# Patient Record
Sex: Female | Born: 1970 | Race: White | Hispanic: No | Marital: Married | Smoking: Never smoker
Health system: Southern US, Community
[De-identification: ages and names within clinical notes are randomized; demographics above are authoritative.]

## PROBLEM LIST (undated history)

## (undated) DIAGNOSIS — J45990 Exercise induced bronchospasm: Secondary | ICD-10-CM

## (undated) DIAGNOSIS — G4730 Sleep apnea, unspecified: Secondary | ICD-10-CM

## (undated) DIAGNOSIS — F32A Depression, unspecified: Secondary | ICD-10-CM

## (undated) DIAGNOSIS — G43909 Migraine, unspecified, not intractable, without status migrainosus: Secondary | ICD-10-CM

## (undated) DIAGNOSIS — K219 Gastro-esophageal reflux disease without esophagitis: Secondary | ICD-10-CM

## (undated) DIAGNOSIS — G4700 Insomnia, unspecified: Secondary | ICD-10-CM

## (undated) DIAGNOSIS — J302 Other seasonal allergic rhinitis: Secondary | ICD-10-CM

## (undated) DIAGNOSIS — D649 Anemia, unspecified: Secondary | ICD-10-CM

## (undated) DIAGNOSIS — E782 Mixed hyperlipidemia: Secondary | ICD-10-CM

## (undated) DIAGNOSIS — F329 Major depressive disorder, single episode, unspecified: Secondary | ICD-10-CM

## (undated) DIAGNOSIS — R030 Elevated blood-pressure reading, without diagnosis of hypertension: Secondary | ICD-10-CM

## (undated) DIAGNOSIS — N979 Female infertility, unspecified: Secondary | ICD-10-CM

## (undated) DIAGNOSIS — N393 Stress incontinence (female) (male): Secondary | ICD-10-CM

## (undated) DIAGNOSIS — E282 Polycystic ovarian syndrome: Secondary | ICD-10-CM

## (undated) DIAGNOSIS — G43009 Migraine without aura, not intractable, without status migrainosus: Secondary | ICD-10-CM

## (undated) DIAGNOSIS — D250 Submucous leiomyoma of uterus: Secondary | ICD-10-CM

## (undated) DIAGNOSIS — N920 Excessive and frequent menstruation with regular cycle: Secondary | ICD-10-CM

## (undated) HISTORY — PX: BLADDER REPAIR: SHX76

## (undated) HISTORY — PX: HYSTERECTOMY: SHX81

## (undated) HISTORY — PX: BLADDER SUSPENSION: SHX72

## (undated) HISTORY — DX: Depression, unspecified: F32.A

## (undated) HISTORY — DX: Stress incontinence (female) (male): N39.3

## (undated) HISTORY — DX: Female infertility, unspecified: N97.9

## (undated) HISTORY — DX: Excessive and frequent menstruation with regular cycle: N92.0

## (undated) HISTORY — DX: Migraine without aura, not intractable, without status migrainosus: G43.009

## (undated) HISTORY — DX: Mixed hyperlipidemia: E78.2

## (undated) HISTORY — DX: Polycystic ovarian syndrome: E28.2

## (undated) HISTORY — DX: Major depressive disorder, single episode, unspecified: F32.9

## (undated) HISTORY — PX: WISDOM TOOTH EXTRACTION: SHX21

## (undated) HISTORY — DX: Migraine, unspecified, not intractable, without status migrainosus: G43.909

## (undated) HISTORY — DX: Elevated blood-pressure reading, without diagnosis of hypertension: R03.0

---

## 2012-09-14 HISTORY — PX: URETHRAL SLING: SHX2621

## 2016-07-01 DIAGNOSIS — R110 Nausea: Secondary | ICD-10-CM | POA: Diagnosis not present

## 2016-07-01 DIAGNOSIS — R5383 Other fatigue: Secondary | ICD-10-CM | POA: Diagnosis not present

## 2016-07-01 DIAGNOSIS — R51 Headache: Secondary | ICD-10-CM | POA: Diagnosis not present

## 2016-07-01 DIAGNOSIS — R42 Dizziness and giddiness: Secondary | ICD-10-CM | POA: Diagnosis not present

## 2016-07-08 DIAGNOSIS — F4323 Adjustment disorder with mixed anxiety and depressed mood: Secondary | ICD-10-CM | POA: Diagnosis not present

## 2016-07-20 DIAGNOSIS — F4323 Adjustment disorder with mixed anxiety and depressed mood: Secondary | ICD-10-CM | POA: Diagnosis not present

## 2016-08-14 DIAGNOSIS — R300 Dysuria: Secondary | ICD-10-CM | POA: Diagnosis not present

## 2016-08-14 DIAGNOSIS — Z6829 Body mass index (BMI) 29.0-29.9, adult: Secondary | ICD-10-CM | POA: Diagnosis not present

## 2016-08-14 DIAGNOSIS — R319 Hematuria, unspecified: Secondary | ICD-10-CM | POA: Diagnosis not present

## 2016-08-31 DIAGNOSIS — Z0000 Encounter for general adult medical examination without abnormal findings: Secondary | ICD-10-CM | POA: Diagnosis not present

## 2016-09-08 DIAGNOSIS — E663 Overweight: Secondary | ICD-10-CM | POA: Diagnosis not present

## 2016-09-08 DIAGNOSIS — G43119 Migraine with aura, intractable, without status migrainosus: Secondary | ICD-10-CM | POA: Diagnosis not present

## 2016-09-08 DIAGNOSIS — J3089 Other allergic rhinitis: Secondary | ICD-10-CM | POA: Diagnosis not present

## 2016-09-08 DIAGNOSIS — Z0000 Encounter for general adult medical examination without abnormal findings: Secondary | ICD-10-CM | POA: Diagnosis not present

## 2016-09-08 DIAGNOSIS — F419 Anxiety disorder, unspecified: Secondary | ICD-10-CM | POA: Diagnosis not present

## 2016-10-06 DIAGNOSIS — Z1231 Encounter for screening mammogram for malignant neoplasm of breast: Secondary | ICD-10-CM | POA: Diagnosis not present

## 2016-10-08 DIAGNOSIS — Z01419 Encounter for gynecological examination (general) (routine) without abnormal findings: Secondary | ICD-10-CM | POA: Diagnosis not present

## 2016-10-08 DIAGNOSIS — Z6829 Body mass index (BMI) 29.0-29.9, adult: Secondary | ICD-10-CM | POA: Diagnosis not present

## 2016-10-13 DIAGNOSIS — R1904 Left lower quadrant abdominal swelling, mass and lump: Secondary | ICD-10-CM | POA: Diagnosis not present

## 2017-02-24 DIAGNOSIS — J018 Other acute sinusitis: Secondary | ICD-10-CM | POA: Diagnosis not present

## 2017-02-24 DIAGNOSIS — H10023 Other mucopurulent conjunctivitis, bilateral: Secondary | ICD-10-CM | POA: Diagnosis not present

## 2017-03-13 DIAGNOSIS — E282 Polycystic ovarian syndrome: Secondary | ICD-10-CM | POA: Diagnosis not present

## 2017-03-13 DIAGNOSIS — R030 Elevated blood-pressure reading, without diagnosis of hypertension: Secondary | ICD-10-CM

## 2017-03-13 DIAGNOSIS — Z888 Allergy status to other drugs, medicaments and biological substances status: Secondary | ICD-10-CM | POA: Diagnosis not present

## 2017-03-13 DIAGNOSIS — I63512 Cerebral infarction due to unspecified occlusion or stenosis of left middle cerebral artery: Secondary | ICD-10-CM | POA: Diagnosis not present

## 2017-03-13 DIAGNOSIS — G43109 Migraine with aura, not intractable, without status migrainosus: Secondary | ICD-10-CM | POA: Diagnosis not present

## 2017-03-13 DIAGNOSIS — E559 Vitamin D deficiency, unspecified: Secondary | ICD-10-CM | POA: Diagnosis not present

## 2017-03-13 DIAGNOSIS — Z7951 Long term (current) use of inhaled steroids: Secondary | ICD-10-CM | POA: Diagnosis not present

## 2017-03-13 DIAGNOSIS — Z79899 Other long term (current) drug therapy: Secondary | ICD-10-CM | POA: Diagnosis not present

## 2017-03-13 DIAGNOSIS — Z823 Family history of stroke: Secondary | ICD-10-CM | POA: Diagnosis not present

## 2017-03-13 DIAGNOSIS — I639 Cerebral infarction, unspecified: Secondary | ICD-10-CM | POA: Diagnosis not present

## 2017-03-13 DIAGNOSIS — R51 Headache: Secondary | ICD-10-CM | POA: Diagnosis not present

## 2017-03-13 DIAGNOSIS — Z793 Long term (current) use of hormonal contraceptives: Secondary | ICD-10-CM | POA: Diagnosis not present

## 2017-03-13 DIAGNOSIS — Z9889 Other specified postprocedural states: Secondary | ICD-10-CM | POA: Diagnosis not present

## 2017-03-13 DIAGNOSIS — E782 Mixed hyperlipidemia: Secondary | ICD-10-CM | POA: Insufficient documentation

## 2017-03-13 DIAGNOSIS — Z820 Family history of epilepsy and other diseases of the nervous system: Secondary | ICD-10-CM | POA: Diagnosis not present

## 2017-03-13 DIAGNOSIS — G43009 Migraine without aura, not intractable, without status migrainosus: Secondary | ICD-10-CM | POA: Insufficient documentation

## 2017-03-13 DIAGNOSIS — Z8249 Family history of ischemic heart disease and other diseases of the circulatory system: Secondary | ICD-10-CM | POA: Diagnosis not present

## 2017-03-13 DIAGNOSIS — R29810 Facial weakness: Secondary | ICD-10-CM | POA: Diagnosis not present

## 2017-03-13 DIAGNOSIS — R202 Paresthesia of skin: Secondary | ICD-10-CM | POA: Diagnosis not present

## 2017-03-13 DIAGNOSIS — Z87828 Personal history of other (healed) physical injury and trauma: Secondary | ICD-10-CM | POA: Diagnosis not present

## 2017-03-13 DIAGNOSIS — R200 Anesthesia of skin: Secondary | ICD-10-CM | POA: Diagnosis not present

## 2017-03-13 HISTORY — DX: Migraine without aura, not intractable, without status migrainosus: G43.009

## 2017-03-13 HISTORY — DX: Mixed hyperlipidemia: E78.2

## 2017-03-13 HISTORY — DX: Elevated blood-pressure reading, without diagnosis of hypertension: R03.0

## 2017-03-14 DIAGNOSIS — E282 Polycystic ovarian syndrome: Secondary | ICD-10-CM | POA: Diagnosis not present

## 2017-03-14 DIAGNOSIS — R030 Elevated blood-pressure reading, without diagnosis of hypertension: Secondary | ICD-10-CM | POA: Diagnosis not present

## 2017-03-14 DIAGNOSIS — G43109 Migraine with aura, not intractable, without status migrainosus: Secondary | ICD-10-CM | POA: Diagnosis not present

## 2017-03-14 DIAGNOSIS — E782 Mixed hyperlipidemia: Secondary | ICD-10-CM | POA: Diagnosis not present

## 2017-03-14 DIAGNOSIS — R29810 Facial weakness: Secondary | ICD-10-CM | POA: Diagnosis not present

## 2017-03-14 DIAGNOSIS — G43009 Migraine without aura, not intractable, without status migrainosus: Secondary | ICD-10-CM | POA: Diagnosis not present

## 2017-03-15 DIAGNOSIS — G43009 Migraine without aura, not intractable, without status migrainosus: Secondary | ICD-10-CM | POA: Diagnosis not present

## 2017-03-15 DIAGNOSIS — R030 Elevated blood-pressure reading, without diagnosis of hypertension: Secondary | ICD-10-CM | POA: Diagnosis not present

## 2017-03-15 DIAGNOSIS — E782 Mixed hyperlipidemia: Secondary | ICD-10-CM | POA: Diagnosis not present

## 2017-03-16 DIAGNOSIS — E782 Mixed hyperlipidemia: Secondary | ICD-10-CM | POA: Diagnosis not present

## 2017-03-16 DIAGNOSIS — G43009 Migraine without aura, not intractable, without status migrainosus: Secondary | ICD-10-CM | POA: Diagnosis not present

## 2017-03-16 DIAGNOSIS — R030 Elevated blood-pressure reading, without diagnosis of hypertension: Secondary | ICD-10-CM | POA: Diagnosis not present

## 2017-03-23 DIAGNOSIS — E282 Polycystic ovarian syndrome: Secondary | ICD-10-CM | POA: Diagnosis not present

## 2017-04-13 DIAGNOSIS — F4323 Adjustment disorder with mixed anxiety and depressed mood: Secondary | ICD-10-CM | POA: Diagnosis not present

## 2017-04-20 DIAGNOSIS — F4323 Adjustment disorder with mixed anxiety and depressed mood: Secondary | ICD-10-CM | POA: Diagnosis not present

## 2017-04-21 DIAGNOSIS — F4323 Adjustment disorder with mixed anxiety and depressed mood: Secondary | ICD-10-CM | POA: Diagnosis not present

## 2017-04-28 DIAGNOSIS — F4323 Adjustment disorder with mixed anxiety and depressed mood: Secondary | ICD-10-CM | POA: Diagnosis not present

## 2017-05-05 DIAGNOSIS — F4323 Adjustment disorder with mixed anxiety and depressed mood: Secondary | ICD-10-CM | POA: Diagnosis not present

## 2017-05-12 DIAGNOSIS — F4323 Adjustment disorder with mixed anxiety and depressed mood: Secondary | ICD-10-CM | POA: Diagnosis not present

## 2017-05-19 DIAGNOSIS — F4323 Adjustment disorder with mixed anxiety and depressed mood: Secondary | ICD-10-CM | POA: Diagnosis not present

## 2017-05-24 DIAGNOSIS — M461 Sacroiliitis, not elsewhere classified: Secondary | ICD-10-CM | POA: Diagnosis not present

## 2017-05-24 DIAGNOSIS — E6609 Other obesity due to excess calories: Secondary | ICD-10-CM | POA: Diagnosis not present

## 2017-05-24 DIAGNOSIS — G43119 Migraine with aura, intractable, without status migrainosus: Secondary | ICD-10-CM | POA: Diagnosis not present

## 2017-05-24 DIAGNOSIS — F419 Anxiety disorder, unspecified: Secondary | ICD-10-CM | POA: Diagnosis not present

## 2017-05-26 DIAGNOSIS — F4323 Adjustment disorder with mixed anxiety and depressed mood: Secondary | ICD-10-CM | POA: Diagnosis not present

## 2017-06-22 DIAGNOSIS — F4323 Adjustment disorder with mixed anxiety and depressed mood: Secondary | ICD-10-CM | POA: Diagnosis not present

## 2017-06-30 DIAGNOSIS — F4323 Adjustment disorder with mixed anxiety and depressed mood: Secondary | ICD-10-CM | POA: Diagnosis not present

## 2017-07-07 DIAGNOSIS — F4323 Adjustment disorder with mixed anxiety and depressed mood: Secondary | ICD-10-CM | POA: Diagnosis not present

## 2017-07-14 DIAGNOSIS — F4323 Adjustment disorder with mixed anxiety and depressed mood: Secondary | ICD-10-CM | POA: Diagnosis not present

## 2017-08-04 DIAGNOSIS — F4323 Adjustment disorder with mixed anxiety and depressed mood: Secondary | ICD-10-CM | POA: Diagnosis not present

## 2017-08-10 DIAGNOSIS — F4323 Adjustment disorder with mixed anxiety and depressed mood: Secondary | ICD-10-CM | POA: Diagnosis not present

## 2017-08-17 DIAGNOSIS — F4323 Adjustment disorder with mixed anxiety and depressed mood: Secondary | ICD-10-CM | POA: Diagnosis not present

## 2017-08-24 DIAGNOSIS — F4323 Adjustment disorder with mixed anxiety and depressed mood: Secondary | ICD-10-CM | POA: Diagnosis not present

## 2017-12-03 DIAGNOSIS — B373 Candidiasis of vulva and vagina: Secondary | ICD-10-CM | POA: Diagnosis not present

## 2017-12-03 DIAGNOSIS — J0140 Acute pansinusitis, unspecified: Secondary | ICD-10-CM | POA: Diagnosis not present

## 2017-12-21 HISTORY — PX: FOOT MASS EXCISION: SHX1663

## 2018-05-02 ENCOUNTER — Ambulatory Visit (INDEPENDENT_AMBULATORY_CARE_PROVIDER_SITE_OTHER): Payer: BLUE CROSS/BLUE SHIELD | Admitting: Obstetrics & Gynecology

## 2018-05-02 ENCOUNTER — Other Ambulatory Visit: Payer: Self-pay

## 2018-05-02 ENCOUNTER — Encounter: Payer: Self-pay | Admitting: Obstetrics & Gynecology

## 2018-05-02 ENCOUNTER — Encounter

## 2018-05-02 ENCOUNTER — Other Ambulatory Visit (HOSPITAL_COMMUNITY)
Admission: RE | Admit: 2018-05-02 | Discharge: 2018-05-02 | Disposition: A | Discharge status: Home / Self Care | Payer: BLUE CROSS/BLUE SHIELD | Source: Ambulatory Visit | Attending: Obstetrics & Gynecology | Admitting: Obstetrics & Gynecology

## 2018-05-02 VITALS — BP 124/78 | HR 96 | Resp 16 | Ht 63.25 in | Wt 190.0 lb

## 2018-05-02 DIAGNOSIS — Z124 Encounter for screening for malignant neoplasm of cervix: Secondary | ICD-10-CM

## 2018-05-02 DIAGNOSIS — N920 Excessive and frequent menstruation with regular cycle: Secondary | ICD-10-CM | POA: Insufficient documentation

## 2018-05-02 DIAGNOSIS — Z1211 Encounter for screening for malignant neoplasm of colon: Secondary | ICD-10-CM | POA: Diagnosis not present

## 2018-05-02 DIAGNOSIS — N393 Stress incontinence (female) (male): Secondary | ICD-10-CM

## 2018-05-02 DIAGNOSIS — N926 Irregular menstruation, unspecified: Secondary | ICD-10-CM

## 2018-05-02 DIAGNOSIS — G43909 Migraine, unspecified, not intractable, without status migrainosus: Secondary | ICD-10-CM | POA: Insufficient documentation

## 2018-05-02 DIAGNOSIS — Z8249 Family history of ischemic heart disease and other diseases of the circulatory system: Secondary | ICD-10-CM

## 2018-05-02 DIAGNOSIS — Z01419 Encounter for gynecological examination (general) (routine) without abnormal findings: Secondary | ICD-10-CM | POA: Diagnosis not present

## 2018-05-02 HISTORY — DX: Excessive and frequent menstruation with regular cycle: N92.0

## 2018-05-02 HISTORY — DX: Stress incontinence (female) (male): N39.3
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
## 2018-05-03 LAB — CYTOLOGY - PAP
Diagnosis: NEGATIVE
HPV: NOT DETECTED

## 2018-05-06 LAB — ANTI MULLERIAN HORMONE: ANTI-MULLERIAN HORMONE (AMH): 0.241 ng/mL

## 2018-05-09 DIAGNOSIS — Z1231 Encounter for screening mammogram for malignant neoplasm of breast: Secondary | ICD-10-CM | POA: Diagnosis not present

## 2018-05-10 ENCOUNTER — Telehealth: Payer: Self-pay | Admitting: Obstetrics & Gynecology

## 2018-05-10 NOTE — Telephone Encounter (Signed)
Routing to [place] for review of [place] results from 5/13.

## 2018-05-10 NOTE — Telephone Encounter (Signed)
Patient called to see if her results from her last visit on 05/02/18 are ready yet.

## 2018-05-10 NOTE — Telephone Encounter (Signed)
Her AMH is 0.2 showing diminshed ovarian reserve.  My guess is that she will menopausal in the next couple of years but this may be longer than she wants to wait.  Is considering hysterectomy.

## 2018-05-11 NOTE — Telephone Encounter (Signed)
Spoke with patient. Results given. Patient verbalizes understanding. Patient would like to proceed with PUS and then further discuss hysterectomy with Dr.Miller. PUS scheduled for 06/02/2018 at 12:30 pm with 1 pm consult with Dr.Miller. Patient is agreeable to date and time.   Routing to provider for final review. Patient agreeable to disposition. Will close encounter.

## 2018-05-17 ENCOUNTER — Ambulatory Visit (INDEPENDENT_AMBULATORY_CARE_PROVIDER_SITE_OTHER): Payer: BLUE CROSS/BLUE SHIELD | Admitting: Family Medicine

## 2018-05-17 ENCOUNTER — Encounter: Payer: Self-pay | Admitting: Family Medicine

## 2018-05-17 VITALS — BP 122/84 | HR 92 | Temp 98.7°F | Ht 63.6 in | Wt 195.1 lb

## 2018-05-17 DIAGNOSIS — Z8659 Personal history of other mental and behavioral disorders: Secondary | ICD-10-CM | POA: Diagnosis not present

## 2018-05-17 DIAGNOSIS — Z0000 Encounter for general adult medical examination without abnormal findings: Secondary | ICD-10-CM | POA: Diagnosis not present

## 2018-05-17 DIAGNOSIS — G43009 Migraine without aura, not intractable, without status migrainosus: Secondary | ICD-10-CM | POA: Diagnosis not present

## 2018-05-17 DIAGNOSIS — J302 Other seasonal allergic rhinitis: Secondary | ICD-10-CM

## 2018-05-17 DIAGNOSIS — Z131 Encounter for screening for diabetes mellitus: Secondary | ICD-10-CM

## 2018-05-17 DIAGNOSIS — E782 Mixed hyperlipidemia: Secondary | ICD-10-CM | POA: Diagnosis not present

## 2018-05-17 DIAGNOSIS — E282 Polycystic ovarian syndrome: Secondary | ICD-10-CM | POA: Diagnosis not present

## 2018-05-17 DIAGNOSIS — Z8249 Family history of ischemic heart disease and other diseases of the circulatory system: Secondary | ICD-10-CM | POA: Diagnosis not present

## 2018-05-17 DIAGNOSIS — Z1211 Encounter for screening for malignant neoplasm of colon: Secondary | ICD-10-CM | POA: Diagnosis not present
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
## 2018-05-17 NOTE — Patient Instructions (Addendum)
Remember the lab in the clinic is open each week day from 7:30 AM to 5:30 PM. Diet for Polycystic Ovarian Syndrome Polycystic ovary syndrome (PCOS) is a disorder of the chemical messengers (hormones) that regulate menstruation. The condition causes important hormones to be out of balance. PCOS can:  Make your periods irregular or stop.  Cause cysts to develop on the ovaries.  Make it difficult to get pregnant.  Stop your body from responding to the effects of insulin (insulin resistance), which can lead to obesity and diabetes.  Changing what you eat can help manage PCOS and improve your health. It can help you lose weight and improve the way your body uses insulin. What is my plan?  Eat breakfast, lunch, and dinner plus two snacks every day.  Include protein in each meal and snack.  Choose whole grains instead of products made with refined flour.  Eat a variety of foods.  Exercise regularly as told by your health care provider. What do I need to know about this eating plan? If you are overweight or obese, pay attention to how many calories you eat. Cutting down on calories can help you lose weight. Work with your health care provider or dietitian to figure out how many calories you need each day. What foods can I eat? Grains Whole grains, such as whole wheat. Whole-grain breads, crackers, cereals, and pasta. Unsweetened oatmeal, bulgur, barley, quinoa, or brown rice. Corn or whole-wheat flour tortillas. Vegetables  Lettuce. Spinach. Peas. Beets. Cauliflower. Cabbage. Broccoli. Carrots. Tomatoes. Squash. Eggplant. Herbs. Peppers. Onions. Cucumbers. Brussels sprouts. Fruits Berries. Bananas. Apples. Oranges. Grapes. Papaya. Mango. Pomegranate. Kiwi. Grapefruit. Cherries. Meats and Other Protein Sources Lean proteins, such as fish, chicken, beans, eggs, and tofu. Dairy Low-fat dairy products, such as skim milk, cheese sticks, and yogurt. Beverages Low-fat or fat-free drinks, such  as water, low-fat milk, sugar-free drinks, and 100% fruit juice. Condiments Ketchup. Mustard. Barbecue sauce. Relish. Low-fat or fat-free mayonnaise. Fats and Oils Olive oil or canola oil. Walnuts and almonds. The items listed above may not be a complete list of recommended foods or beverages. Contact your dietitian for more options. What foods are not recommended? Foods high in calories or fat. Fried foods. Sweets. Products made from refined white flour, including white bread, pastries, white rice, and pasta. The items listed above may not be a complete list of foods and beverages to avoid. Contact your dietitian for more information. This information is not intended to replace advice given to you by your health care provider. Make sure you discuss any questions you have with your health care provider. Document Released: 03/30/2016 Document Revised: 05/14/2016 Document Reviewed: 12/19/2014 Elsevier Interactive Patient Education  2018 [place].  Polycystic Ovarian Syndrome Polycystic ovarian syndrome (PCOS) is a common hormonal disorder among women of reproductive age. In most women with PCOS, many small fluid-filled sacs (cysts) grow on the ovaries, and the cysts are not part of a normal menstrual cycle. PCOS can cause problems with your menstrual periods and make it difficult to get pregnant. It can also cause an increased risk of miscarriage with pregnancy. If it is not treated, PCOS can lead to serious health problems, such as diabetes and heart disease. What are the causes? The cause of PCOS is not known, but it may be the result of a combination of certain factors, such as:  Irregular menstrual cycle.  High levels of certain hormones (androgens).  Problems with the hormone that helps to control blood sugar (insulin resistance).  Certain genes.  What increases the risk? This condition is more likely to develop in women who have a family history of PCOS. What are the signs or  symptoms? Symptoms of PCOS may include:  Multiple ovarian cysts.  Infrequent periods or no periods.  Periods that are too frequent or too heavy.  Unpredictable periods.  Inability to get pregnant (infertility) because of not ovulating.  Increased growth of hair on the face, chest, stomach, back, thumbs, thighs, or toes.  Acne or oily skin. Acne may develop during adulthood, and it may not respond to treatment.  Pelvic pain.  Weight gain or obesity.  Patches of thickened and dark brown or black skin on the neck, arms, breasts, or thighs (acanthosis nigricans).  Excess hair growth on the face, chest, abdomen, or upper thighs (hirsutism).  How is this diagnosed? This condition is diagnosed based on:  Your medical history.  A physical exam, including a pelvic exam. Your health care provider may look for areas of increased hair growth on your skin.  Tests, such as: ? Ultrasound. This may be used to examine the ovaries and the lining of the uterus (endometrium) for cysts. ? Blood tests. These may be used to check levels of sugar (glucose), female hormone (testosterone), and female hormones (estrogen and progesterone) in your blood.  How is this treated? There is no cure for PCOS, but treatment can help to manage symptoms and prevent more health problems from developing. Treatment varies depending on:  Your symptoms.  Whether you want to have a baby or whether you need birth control (contraception).  Treatment may include nutrition and lifestyle changes along with:  Progesterone hormone to start a menstrual period.  Birth control pills to help you have regular menstrual periods.  Medicines to make you ovulate, if you want to get pregnant.  Medicine to reduce excessive hair growth.  Surgery, in severe cases. This may involve making small holes in one or both of your ovaries. This decreases the amount of testosterone that your body produces.  Follow these instructions at  home:  Take over-the-counter and prescription medicines only as told by your health care provider.  Follow a healthy meal plan. This can help you reduce the effects of PCOS. ? Eat a healthy diet that includes lean proteins, complex carbohydrates, fresh fruits and vegetables, low-fat dairy products, and healthy fats. Make sure to eat enough fiber.  If you are overweight, lose weight as told by your health care provider. ? Losing 10% of your body weight may improve symptoms. ? Your health care provider can determine how much weight loss is best for you and can help you lose weight safely.  Keep all follow-up visits as told by your health care provider. This is important. Contact a health care provider if:  Your symptoms do not get better with medicine.  You develop new symptoms. This information is not intended to replace advice given to you by your health care provider. Make sure you discuss any questions you have with your health care provider. Document Released: 04/02/2005 Document Revised: 08/04/2016 Document Reviewed: 05/24/2016 Elsevier Interactive Patient Education  2018 [place] Years, Female Preventive care refers to lifestyle choices and visits with your health care provider that can promote health and wellness. What does preventive care include?  A yearly physical exam. This is also called an annual well check.  Dental exams once or twice a year.  Routine eye exams. Ask your health care provider how often  you should have your eyes checked.  Personal lifestyle choices, including: ? Daily care of your teeth and gums. ? Regular physical activity. ? Eating a healthy diet. ? Avoiding tobacco and drug use. ? Limiting alcohol use. ? Practicing safe sex. ? Taking low-dose aspirin daily starting at age 75. ? Taking vitamin and mineral supplements as recommended by your health care provider. What happens during an annual well check? The services  and screenings done by your health care provider during your annual well check will depend on your age, overall health, lifestyle risk factors, and family history of disease. Counseling Your health care provider may ask you questions about your:  Alcohol use.  Tobacco use.  Drug use.  Emotional well-being.  Home and relationship well-being.  Sexual activity.  Eating habits.  Work and work Statistician.  Method of birth control.  Menstrual cycle.  Pregnancy history.  Screening You may have the following tests or measurements:  Height, weight, and BMI.  Blood pressure.  Lipid and cholesterol levels. These may be checked every 5 years, or more frequently if you are over 67 years old.  Skin check.  Lung cancer screening. You may have this screening every year starting at age 46 if you have a 30-pack-year history of smoking and currently smoke or have quit within the past 15 years.  Fecal occult blood test (FOBT) of the stool. You may have this test every year starting at age 27.  Flexible sigmoidoscopy or colonoscopy. You may have a sigmoidoscopy every 5 years or a colonoscopy every 10 years starting at age 79.  Hepatitis C blood test.  Hepatitis B blood test.  Sexually transmitted disease (STD) testing.  Diabetes screening. This is done by checking your blood sugar (glucose) after you have not eaten for a while (fasting). You may have this done every 1-3 years.  Mammogram. This may be done every 1-2 years. Talk to your health care provider about when you should start having regular mammograms. This may depend on whether you have a family history of breast cancer.  BRCA-related cancer screening. This may be done if you have a family history of breast, ovarian, tubal, or peritoneal cancers.  Pelvic exam and Pap test. This may be done every 3 years starting at age 72. Starting at age 91, this may be done every 5 years if you have a Pap test in combination with an HPV  test.  Bone density scan. This is done to screen for osteoporosis. You may have this scan if you are at high risk for osteoporosis.  Discuss your test results, treatment options, and if necessary, the need for more tests with your health care provider. Vaccines Your health care provider may recommend certain vaccines, such as:  Influenza vaccine. This is recommended every year.  Tetanus, diphtheria, and acellular pertussis (Tdap, Td) vaccine. You may need a Td booster every 10 years.  Varicella vaccine. You may need this if you have not been vaccinated.  Zoster vaccine. You may need this after age 35.  Measles, mumps, and rubella (MMR) vaccine. You may need at least one dose of MMR if you were born in 1957 or later. You may also need a second dose.  Pneumococcal 13-valent conjugate (PCV13) vaccine. You may need this if you have certain conditions and were not previously vaccinated.  Pneumococcal polysaccharide (PPSV23) vaccine. You may need one or two doses if you smoke cigarettes or if you have certain conditions.  Meningococcal vaccine. You may need this if  you have certain conditions.  Hepatitis A vaccine. You may need this if you have certain conditions or if you travel or work in places where you may be exposed to hepatitis A.  Hepatitis B vaccine. You may need this if you have certain conditions or if you travel or work in places where you may be exposed to hepatitis B.  Haemophilus influenzae type b (Hib) vaccine. You may need this if you have certain conditions.  Talk to your health care provider about which screenings and vaccines you need and how often you need them. This information is not intended to replace advice given to you by your health care provider. Make sure you discuss any questions you have with your health care provider. Document Released: 01/03/2016 Document Revised: 08/26/2016 Document Reviewed: 10/08/2015 Elsevier Interactive Patient Education  [employer].

## 2018-05-20 ENCOUNTER — Other Ambulatory Visit (INDEPENDENT_AMBULATORY_CARE_PROVIDER_SITE_OTHER): Payer: BLUE CROSS/BLUE SHIELD

## 2018-05-20 DIAGNOSIS — Z131 Encounter for screening for diabetes mellitus: Secondary | ICD-10-CM | POA: Diagnosis not present

## 2018-05-20 DIAGNOSIS — E782 Mixed hyperlipidemia: Secondary | ICD-10-CM

## 2018-05-20 DIAGNOSIS — Z0000 Encounter for general adult medical examination without abnormal findings: Secondary | ICD-10-CM

## 2018-05-20 LAB — CBC WITH DIFFERENTIAL/PLATELET
BASOS ABS: 0.1 10*3/uL (ref 0.0–0.1)
Basophils Relative: 1 % (ref 0.0–3.0)
EOS ABS: 0.1 10*3/uL (ref 0.0–0.7)
Eosinophils Relative: 1.1 % (ref 0.0–5.0)
HEMATOCRIT: 39 % (ref 36.0–46.0)
Hemoglobin: 13.1 g/dL (ref 12.0–15.0)
LYMPHS PCT: 21.4 % (ref 12.0–46.0)
Lymphs Abs: 1.8 10*3/uL (ref 0.7–4.0)
MCHC: 33.5 g/dL (ref 30.0–36.0)
MCV: 96.4 fl (ref 78.0–100.0)
Monocytes Absolute: 0.5 10*3/uL (ref 0.1–1.0)
Monocytes Relative: 5.3 % (ref 3.0–12.0)
NEUTROS PCT: 71.2 % (ref 43.0–77.0)
Neutro Abs: 6.1 10*3/uL (ref 1.4–7.7)
PLATELETS: 465 10*3/uL — AB (ref 150.0–400.0)
RBC: 4.05 Mil/uL (ref 3.87–5.11)
RDW: 12.4 % (ref 11.5–15.5)
WBC: 8.6 10*3/uL (ref 4.0–10.5)

## 2018-05-20 LAB — BASIC METABOLIC PANEL
BUN: 17 mg/dL (ref 6–23)
CALCIUM: 9.2 mg/dL (ref 8.4–10.5)
CO2: 26 meq/L (ref 19–32)
CREATININE: 0.75 mg/dL (ref 0.40–1.20)
Chloride: 104 mEq/L (ref 96–112)
GFR: 87.86 mL/min (ref >60.00)
Glucose, Bld: 90 mg/dL (ref 70–99)
Potassium: 4.1 mEq/L (ref 3.5–5.1)
Sodium: 138 mEq/L (ref 135–145)

## 2018-05-20 LAB — LIPID PANEL
CHOLESTEROL: 200 mg/dL (ref 0–200)
HDL: 69.5 mg/dL (ref >39.00)
LDL CALC: 98 mg/dL (ref 0–99)
NONHDL: 130.83
Total CHOL/HDL Ratio: 3
Triglycerides: 164 mg/dL — ABNORMAL HIGH (ref 0.0–149.0)
VLDL: 32.8 mg/dL (ref 0.0–40.0)

## 2018-05-20 LAB — HEMOGLOBIN A1C: HEMOGLOBIN A1C: 5.5 % (ref 4.6–6.5)

## 2018-05-24 ENCOUNTER — Encounter: Payer: Self-pay | Admitting: Obstetrics & Gynecology

## 2018-05-25 ENCOUNTER — Telehealth: Payer: Self-pay | Admitting: Family Medicine

## 2018-05-25 LAB — FECAL OCCULT BLOOD, IMMUNOCHEMICAL: Fecal Occult Bld: NEGATIVE

## 2018-05-25 NOTE — Telephone Encounter (Signed)
Copied from [place] [id number]. Topic: Quick Communication - See Telephone Encounter >> May 25, 2018  4:25 PM Bea Graff, NT wrote: CRM for notification. See Telephone encounter for: 05/25/18. Pt calling back to get lab results.

## 2018-05-26 NOTE — Telephone Encounter (Signed)
Patient notified

## 2018-06-01 ENCOUNTER — Other Ambulatory Visit: Payer: Self-pay | Admitting: *Deleted

## 2018-06-01 DIAGNOSIS — N921 Excessive and frequent menstruation with irregular cycle: Secondary | ICD-10-CM

## 2018-06-01 DIAGNOSIS — N946 Dysmenorrhea, unspecified: Secondary | ICD-10-CM

## 2018-06-02 ENCOUNTER — Ambulatory Visit (INDEPENDENT_AMBULATORY_CARE_PROVIDER_SITE_OTHER): Payer: BLUE CROSS/BLUE SHIELD | Admitting: Obstetrics & Gynecology

## 2018-06-02 ENCOUNTER — Encounter: Payer: Self-pay | Admitting: Obstetrics & Gynecology

## 2018-06-02 ENCOUNTER — Ambulatory Visit (INDEPENDENT_AMBULATORY_CARE_PROVIDER_SITE_OTHER): Payer: BLUE CROSS/BLUE SHIELD

## 2018-06-02 VITALS — BP 122/70 | HR 80 | Resp 16 | Ht 63.25 in | Wt 190.0 lb

## 2018-06-02 DIAGNOSIS — N921 Excessive and frequent menstruation with irregular cycle: Secondary | ICD-10-CM | POA: Diagnosis not present

## 2018-06-02 DIAGNOSIS — N946 Dysmenorrhea, unspecified: Secondary | ICD-10-CM

## 2018-06-02 DIAGNOSIS — G43009 Migraine without aura, not intractable, without status migrainosus: Secondary | ICD-10-CM | POA: Diagnosis not present

## 2018-06-02 MED ORDER — NORGESTIM-ETH ESTRAD TRIPHASIC 0.18/0.215/0.25 MG-35 MCG PO TABS
1.0000 | ORAL_TABLET | Freq: Every day | ORAL | 2 refills | Status: DC
Start: 2018-06-02 — End: 2018-06-28

## 2018-06-02 NOTE — Progress Notes (Signed)
47 y.o. [id number] Married Caucasian female here for pelvic ultrasound due to menorrhagia and dysmenorrhea.  Recently stopped OCPs (after having a Botox injection that caused a facial nerve paralysis that resolved but was initially thought was a stroke).  She has undergone extensive conversation with her prior gynecologist, Dr. Margaretmary Dys, as well as her neurologist, Dr. Winfred Burn, in [place], regarding hysterectomy and ovary removal.  When she stopped the pills, her cycles were much worse and heavier.  In addition, her headaches have been worse worse.  They have always been cyclical in nature.  She did decide to restart the OCPs but really does not want to be back on these long-term.  Her neurologist thinks using OCPs are ok in the short term but would have her consider other options as well.  They do help significantly.  She is here for ultrasound as one has not been done recently and to discuss additional options, if appropriate.    Patient's last menstrual period was 05/03/2018.  Contraception: OCPs and condoms  Findings:  UTERUS: 7.7 x 4.6 x 4.1cm with two small fibroids that have submucosal components--1cm and 1.4cm EMS: 2.56mm ADNEXA: Left ovary: 2.3 x 1.5 x 1.2cm       Right ovary: 2.4 x 1.4 x 1.0cm CUL DE SAC: no free fluid  Discussion:  Findings reviewed with pt including location of fibroids and appearance of ovaries.  She continues to contemplate hysterectomy with ovary removal.  Procedure discussed with patient.  Hospital stay, recovery and pain management all discussed.  Risks discussed including but not limited to bleeding, 1% risk of receiving a  transfusion, infection, 3-4% risk of bowel/bladder/ureteral/vascular injury discussed as well as possible need for additional surgery if injury does occur discussed.  DVT/PE and rare risk of death discussed.  My actual complications with prior surgeries discussed.  Vaginal cuff dehiscence discussed.  Hernia formation discussed.  Positioning and  incision locations discussed.  Patient aware if pathology abnormal she may need additional treatment.    Also, ovary removal at this point would increase osteoporosis risk and cardiovascular risk.  I would recommend she start HRT after her surgery due to age, unless this causes increased headaches.    We also discussed additional options for treatment including Mirena IUD, other progesterone options including POPs and Depo Provera.  Pt understands these would also provide contraception so she would not need anything else for this concern.  She thinks she does not want to be on hormonal therapy for treatment but does want to discuss with her husband.  Information provided.  Questions answered.    Assessment:  Dysmenorrhea, menorrhagia, migraine headaches  Plan:  RF for Tirprevifem sent to pharmacy on file for pt Will have hysterectomy and IUD precerted for pt.    ~73 minutes spent with patient >50% of time was in face to face discussion of above.

## 2018-06-08 ENCOUNTER — Telehealth: Payer: Self-pay | Admitting: Obstetrics & Gynecology

## 2018-06-08 NOTE — Telephone Encounter (Signed)
Call placed to patient to review benefits for possible Mirena IUD insertion and possible hysterectomy. Left voicemail message requesting a return call

## 2018-06-09 NOTE — Telephone Encounter (Signed)
Patient returned call. Spoke with pt regarding benefits for recommended procedures. Patient understood and agreeable. Patient advises she would like to proceed with the surgical procedure. Patient has confirmed. Patient aware this is professional benefit only. Patient aware will be contacted by hospital for separate benefits. Forwarding to Conservation officer, historic buildings for scheduling  Routing to Lamont Snowball, RN

## 2018-06-09 NOTE — Telephone Encounter (Signed)
Call to patient. Surgery date options discussed. Patient requests to proceed with 07-25-18. Advised will schedule and call patient back with confirmation and complete surgery instructions and appointments.

## 2018-06-10 NOTE — Telephone Encounter (Signed)
Patient returning call to Sally. °

## 2018-06-13 NOTE — Telephone Encounter (Signed)
Call back to patient. Advised surgery is currently scheduled for 07-25-18 as previously discussed.  Checking with Dr Sabra Heck to be surge this is appropriate for 2nd case before proceeding further. Patient voiced understanding. Advised will call her back.

## 2018-06-20 NOTE — Telephone Encounter (Signed)
Patient is requesting to talk with Gay Filler about her upcoming surgery.

## 2018-06-20 NOTE — Telephone Encounter (Signed)
Return call to patient.  Advised have confirmed surgery date with Dr Sabra Heck.  Surgery instruction sheet reviewed and printed copy will be mailed as well as provided consult.   Routing to provider for final review. Will close encounter.

## 2018-06-21 ENCOUNTER — Ambulatory Visit: Payer: Self-pay | Admitting: Nurse Practitioner

## 2018-06-27 ENCOUNTER — Telehealth: Payer: Self-pay | Admitting: Obstetrics & Gynecology

## 2018-06-27 NOTE — Telephone Encounter (Signed)
Spoke with patient. In 3rd wk of 2nd pack of Tri-previfem, bleeding has not stopped. Describes flow as mild to moderate, changing pad 3 x/day on average. Reports increased menses cramps and back pain, craving red meat and fatigue. No missed or late pills. Scheduled for TLH on 07/25/18. Patient asking if OCP can be changed?   Advised will review with Dr. Sabra Heck and return call. Pharmacy on file verified.   Dr. Sabra Heck -please review and advise?

## 2018-06-27 NOTE — Telephone Encounter (Signed)
Patient called stating she is scheduled for surgery on 07/25/18. Patient states she has had continual bleeding for three weeks and wants to know if her birth control pills need to be changed.  Routing to Triage Nurse

## 2018-06-28 ENCOUNTER — Other Ambulatory Visit: Payer: Self-pay | Admitting: Obstetrics & Gynecology

## 2018-06-28 MED ORDER — NORETHINDRONE ACET-ETHINYL EST 1.5-30 MG-MCG PO TABS: 1.0000 | ORAL_TABLET | Freq: Every day | ORAL | 0 refills | Status: DC

## 2018-06-28 NOTE — Telephone Encounter (Signed)
Spoke with patient, advised as seen below per Dr. Sabra Heck. Rx to verified pharmacy. Patient verbalizes understanding and is agreeable. Encounter closed.

## 2018-06-28 NOTE — Telephone Encounter (Signed)
Yes.  Ok to change to Loestrin 1.5/30.  I would stop pills for five days and have whatever cycle occurs.  Then restart new pack.  Ok to send to pharmacy.

## 2018-06-28 NOTE — Telephone Encounter (Signed)
Patient calling regarding conversation with Sharee Pimple 06/27/18.

## 2018-06-28 NOTE — Telephone Encounter (Signed)
Routing to Dr. Miller

## 2018-07-05 ENCOUNTER — Other Ambulatory Visit: Payer: Self-pay

## 2018-07-05 ENCOUNTER — Ambulatory Visit (INDEPENDENT_AMBULATORY_CARE_PROVIDER_SITE_OTHER): Payer: BLUE CROSS/BLUE SHIELD | Admitting: Obstetrics & Gynecology

## 2018-07-05 ENCOUNTER — Encounter: Payer: Self-pay | Admitting: Obstetrics & Gynecology

## 2018-07-05 VITALS — BP 120/84 | HR 88 | Resp 16 | Ht 63.25 in | Wt 191.0 lb

## 2018-07-05 DIAGNOSIS — N921 Excessive and frequent menstruation with irregular cycle: Secondary | ICD-10-CM | POA: Diagnosis not present

## 2018-07-05 NOTE — Progress Notes (Signed)
47 y.o. [id number] MarriedCaucasian female here for discussion of upcoming procedure due to menorrhagia with irregular bleeding.  She stopped the triprevafem due to persistent bleeding for a month.  When she stopped the OCPs she had a very heavy cycle that lasted for a week.  She is now on Loestrin and she is only have a little spotting.  She moved from [place] where she and her previous gynecologist discussed treatment of bleeding and dysmenorrhea (as well as headaches).  She had decided to proceed with hysterectomy but then needed to move.  She has decided at this time to proceed with definitive treatment.  She has seriously contemplated ovary removal as well due to menstrual related headaches.  She has decided she does want to proceed with ovary removal as well.  She understands increased CVD risk as well as increased risks for osteopenia/osteoporosis.  She would like to start on HRT after surgery, if possible.  Discussed with patient risks and benefits including but not limited to risks of increased risks of heart disease, MI, stroke, DVT, and breast cancer.  She is ok with these risks.  Ultrasound performed 06/02/18 showed uterus measuring 7.7 x 4.6 x 4.1cm with two fibroids with submucosal components.  Endometrial biopsy recommended today as well.   Procedure discussed with patient.  Hospital stay, recovery and pain management all discussed.  Risks discussed including but not limited to bleeding, 1% risk of receiving a  transfusion, infection, 3-4% risk of bowel/bladder/ureteral/vascular injury discussed as well as possible need for additional surgery if injury does occur discussed.  DVT/PE and rare risk of death discussed.  My actual complications with prior surgeries discussed.  Vaginal cuff dehiscence discussed.  Hernia formation discussed.  Positioning and incision locations discussed.  Patient aware if pathology abnormal she may need additional treatment.  All questions answered.    Ob Hx:   No LMP  recorded.          Sexually active: Yes.   Birth control: oral contraceptives (estrogen/progesterone) Last pap: 05/02/18 Neg. HR HPV:neg  Last MMG: 05/09/18 [facility]. Normal  Tobacco: No  Past Surgical History:  Procedure Laterality Date  . URETHRAL SLING  09/14/2012   [place]    Past Medical History:  Diagnosis Date  . Depression   . Infertility, female   . Migraines   . PCOS (polycystic ovarian syndrome)   . Urinary incontinence     Allergies: Dexamethasone sodium phosphate and Pseudoephedrine hcl  Current Outpatient Medications  Medication Sig Dispense Refill  . cetirizine (ZYRTEC) 10 MG tablet Take by mouth daily as needed.    . cholecalciferol (VITAMIN D) 1000 units tablet Take by mouth daily.    . Cyanocobalamin (VITAMIN B 12 PO) Take by mouth daily.    . Diclofenac Potassium (ZIPSOR) 25 MG CAPS Take 1 capsule by mouth daily as needed.    . DULoxetine (CYMBALTA) 60 MG capsule Take 1 capsule by mouth daily.    Marland Kitchen eletriptan (RELPAX) 40 MG tablet Take 1 tablet by mouth 2 (two) times daily as needed.    Marland Kitchen Galcanezumab-gnlm (EMGALITY Plum) Inject into the skin every 30 (thirty) days.    . Magnesium 100 MG CAPS Take by mouth.    . Multiple Vitamins-Minerals (SUPER THERA VITE M) TABS Take by mouth daily.    . Norethindrone Acetate-Ethinyl Estradiol (LOESTRIN 1.5/30, 21,) 1.5-30 MG-MCG tablet Take 1 tablet by mouth daily. 3 Package 0  . OnabotulinumtoxinA (BOTOX IJ) every 3 (three) months.    . Probiotic Product (PROBIOTIC-10)  CAPS Take by mouth daily.    . SUMAtriptan Succinate (SUMAVEL DOSEPRO) 6 MG/0.5ML SOTJ as needed.    . traZODone (DESYREL) 50 MG tablet Take 1 tablet by mouth at bedtime.     No current facility-administered medications for this visit.     ROS: Pertinent items noted in HPI and remainder of comprehensive ROS otherwise negative.  Exam:    BP 120/84 (BP Location: Right Arm, Patient Position: Sitting, Cuff Size: Large)   Pulse 88   Resp 16   Ht 5' 3.25"  (1.607 m)   Wt 191 lb (86.6 kg)   BMI 33.57 kg/m   General appearance: alert and cooperative Head: Normocephalic, without obvious abnormality, atraumatic Neck: no adenopathy, supple, symmetrical, trachea midline and thyroid not enlarged, symmetric, no tenderness/mass/nodules Lungs: clear to auscultation bilaterally Heart: regular rate and rhythm, S1, S2 normal, no murmur, click, rub or gallop Abdomen: soft, non-tender; bowel sounds normal; no masses,  no organomegaly Extremities: extremities normal, atraumatic, no cyanosis or edema Skin: Skin color, texture, turgor normal. No rashes or lesions Lymph nodes: Cervical, supraclavicular, and axillary nodes normal. no inguinal nodes palpated Neurologic: Grossly normal  Pelvic: External genitalia:  no lesions              Urethra: normal appearing urethra with no masses, tenderness or lesions              Bartholins and Skenes: Bartholin's, Urethra, Skene's normal                 Vagina: normal appearing vagina with normal color and discharge, no lesions              Cervix: normal appearance              Pap taken: No.        Bimanual Exam:  Uterus:  uterus is normal size, shape, consistency and nontender                                      Adnexa:    normal adnexa in size, nontender and no masses                                      Anus:  normal sphincter tone, no lesions  Endometrial biopsy recommended.  Discussed with patient.  Verbal and written consent obtained.   Procedure:  Speculum placed.  Cervix visualized and cleansed with betadine prep.  A single toothed tenaculum was applied to the anterior lip of the cervix.  Endometrial pipelle was advanced through the cervix into the endometrial cavity without difficulty.  Pipelle passed to 7cm.  Suction applied and pipelle removed with good tissue sample obtained.  Tenculum removed.  No bleeding noted.  Patient tolerated procedure well.  A: Irregular bleeding Submucosal  fibroids Dysmenorrhea Hormonal headaches    P:  TLH/BSO/cystoscopy planned Endometrial biopsy obtained today Medications/Vitamins reviewed.  Hysterectomy brochure given for pre and post op instructions.

## 2018-07-06 ENCOUNTER — Telehealth: Payer: Self-pay | Admitting: Obstetrics & Gynecology

## 2018-07-06 DIAGNOSIS — N921 Excessive and frequent menstruation with irregular cycle: Secondary | ICD-10-CM | POA: Diagnosis not present

## 2018-07-06 NOTE — Telephone Encounter (Signed)
Patient came in yesterday for a pre-op appointment. Patient is wondering if probiotic counts as an herbal supplement that she needs to stop 7 days before surgery.

## 2018-07-06 NOTE — Telephone Encounter (Signed)
Call to patient. Scheduled for surgery with Dr. Sabra Heck. She is not at home number and called cell phone. Per medical release of information signed by patient okay for detailed messages to be left on both home and cell phone.  Detailed message left on mobile phone that confirms patient name that it is okay to continue with probiotic until the day before her surgery and then follow instructions per hospital pre-op nurse.  Advised to please call back if any additional questions or clarification needed.   Detailed message left. Encounter closed.

## 2018-07-13 ENCOUNTER — Encounter: Payer: Self-pay | Admitting: Obstetrics & Gynecology

## 2018-07-18 ENCOUNTER — Encounter (HOSPITAL_COMMUNITY): Payer: Self-pay

## 2018-07-18 NOTE — Pre-Procedure Instructions (Signed)
ECHO 03/14/17 in care everywhere

## 2018-07-18 NOTE — Patient Instructions (Addendum)
Your procedure is scheduled on:  Monday, Aug. 5, 2019  Report to [facility]. M.   Call this number if you have problems the morning of surgery:[phone].   OUR ADDRESS IS [address].  WE ARE LOCATED IN THE [place]                                   [facility].                                     REMEMBER:  DO NOT EAT FOOD OR DRINK LIQUIDS AFTER MIDNIGHT .    TAKE THESE MEDICATIONS MORNING OF SURGERY WITH A SIP OF WATER:  Cymbalta, Cetirizine if needed  DO NOT WEAR JEWERLY, MAKE UP, OR NAIL POLISH.  DO NOT WEAR LOTIONS, POWDERS, PERFUMES/COLOGNE OR DEODORANT.  DO NOT SHAVE FOR 24 HOURS PRIOR TO DAY OF SURGERY.  CONTACTS, GLASSES, OR DENTURES MAY NOT BE WORN TO SURGERY.  Bring CPAP machine and supplies                                    [facility] IS NOT RESPONSIBLE  FOR ANY BELONGINGS.                                                                     [facility] Before surgery, you can play an important role.  Because skin is not sterile, your skin needs to be as free of germs as possible.  You can reduce the number of germs on your skin by washing with CHG (chlorahexidine gluconate) soap before surgery.  CHG is an antiseptic cleaner which kills germs and bonds with the skin to continue killing germs even after washing. Please DO NOT use if you have an allergy to CHG or antibacterial soaps.  If your skin becomes reddened/irritated stop using the CHG and inform your nurse when you arrive at Short Stay. Do not shave (including legs and underarms) for at least 48 hours prior to the first CHG shower.  You may shave your face/neck.  Please follow these instructions carefully:  1.  Shower with CHG Soap the night before surgery and the  morning of surgery.  2.  If you choose to wash your hair, wash your hair first as usual with your normal  shampoo.  3.  After you shampoo, rinse your hair and body thoroughly to remove  the shampoo.                             4.  Use CHG as you would any other liquid soap.  You can apply chg directly to the skin and wash.  Gently with a scrungie or clean washcloth.  5.  Apply the CHG Soap to your body ONLY FROM THE NECK DOWN.   Do   not use on face/ open  Wound or open sores. Avoid contact with eyes, ears mouth and   genitals (private parts).                       Wash face,  Genitals (private parts) with your normal soap.             6.  Wash thoroughly, paying special attention to the area where your    surgery  will be performed.  7.  Thoroughly rinse your body with warm water from the neck down.  8.  DO NOT shower/wash with your normal soap after using and rinsing off the CHG Soap.                9.  Pat yourself dry with a clean towel.            10.  Wear clean pajamas.            11.  Place clean sheets on your bed the night of your first shower and do not  sleep with pets. Day of Surgery : Do not apply any lotions/deodorants the morning of surgery.  Please wear clean clothes to the hospital/surgery center.  FAILURE TO FOLLOW THESE INSTRUCTIONS MAY RESULT IN THE CANCELLATION OF YOUR SURGERY  PATIENT SIGNATURE_________________________________  NURSE SIGNATURE__________________________________  ________________________________________________________________________

## 2018-07-19 ENCOUNTER — Encounter (HOSPITAL_COMMUNITY)
Admission: RE | Admit: 2018-07-19 | Discharge: 2018-07-19 | Disposition: A | Discharge status: Home / Self Care | Payer: BLUE CROSS/BLUE SHIELD | Source: Ambulatory Visit | Attending: Nurse Practitioner | Admitting: Nurse Practitioner

## 2018-07-19 ENCOUNTER — Other Ambulatory Visit: Payer: Self-pay

## 2018-07-19 ENCOUNTER — Encounter (HOSPITAL_COMMUNITY): Payer: Self-pay

## 2018-07-19 DIAGNOSIS — N946 Dysmenorrhea, unspecified: Secondary | ICD-10-CM | POA: Diagnosis not present

## 2018-07-19 DIAGNOSIS — N920 Excessive and frequent menstruation with regular cycle: Secondary | ICD-10-CM | POA: Insufficient documentation

## 2018-07-19 DIAGNOSIS — Z01812 Encounter for preprocedural laboratory examination: Secondary | ICD-10-CM | POA: Insufficient documentation

## 2018-07-19 DIAGNOSIS — D250 Submucous leiomyoma of uterus: Secondary | ICD-10-CM | POA: Insufficient documentation

## 2018-07-19 HISTORY — DX: Exercise induced bronchospasm: J45.990

## 2018-07-19 HISTORY — DX: Anemia, unspecified: D64.9

## 2018-07-19 HISTORY — DX: Other seasonal allergic rhinitis: J30.2

## 2018-07-19 HISTORY — DX: Submucous leiomyoma of uterus: D25.0

## 2018-07-19 HISTORY — DX: Gastro-esophageal reflux disease without esophagitis: K21.9

## 2018-07-19 HISTORY — DX: Sleep apnea, unspecified: G47.30

## 2018-07-19 HISTORY — DX: Insomnia, unspecified: G47.00

## 2018-07-19 LAB — CBC
HCT: 39.2 % (ref 36.0–46.0)
HEMOGLOBIN: 13.1 g/dL (ref 12.0–15.0)
MCH: 32.2 pg (ref 26.0–34.0)
MCHC: 33.4 g/dL (ref 30.0–36.0)
MCV: 96.3 fL (ref 78.0–100.0)
Platelets: 484 10*3/uL — ABNORMAL HIGH (ref 150–400)
RBC: 4.07 MIL/uL (ref 3.87–5.11)
RDW: 12.8 % (ref 11.5–15.5)
WBC: 8.3 10*3/uL (ref 4.0–10.5)

## 2018-07-19 NOTE — Pre-Procedure Instructions (Signed)
CBC results 07/19/2018 faxed to Dr. Sabra Heck via epic.

## 2018-07-25 ENCOUNTER — Ambulatory Visit (HOSPITAL_BASED_OUTPATIENT_CLINIC_OR_DEPARTMENT_OTHER)
Admission: RE | Admit: 2018-07-25 | Discharge: 2018-07-26 | Disposition: A | Discharge status: Home / Self Care | Payer: BLUE CROSS/BLUE SHIELD | Source: Ambulatory Visit | Attending: Obstetrics & Gynecology | Admitting: Obstetrics & Gynecology

## 2018-07-25 ENCOUNTER — Encounter (HOSPITAL_BASED_OUTPATIENT_CLINIC_OR_DEPARTMENT_OTHER): Payer: Self-pay

## 2018-07-25 ENCOUNTER — Ambulatory Visit (HOSPITAL_BASED_OUTPATIENT_CLINIC_OR_DEPARTMENT_OTHER): Payer: BLUE CROSS/BLUE SHIELD | Admitting: Anesthesiology

## 2018-07-25 ENCOUNTER — Encounter (HOSPITAL_BASED_OUTPATIENT_CLINIC_OR_DEPARTMENT_OTHER)
Admission: RE | Disposition: A | Discharge status: Home / Self Care | Payer: Self-pay | Source: Ambulatory Visit | Attending: Obstetrics & Gynecology

## 2018-07-25 DIAGNOSIS — N8301 Follicular cyst of right ovary: Secondary | ICD-10-CM | POA: Diagnosis not present

## 2018-07-25 DIAGNOSIS — N888 Other specified noninflammatory disorders of cervix uteri: Secondary | ICD-10-CM | POA: Diagnosis not present

## 2018-07-25 DIAGNOSIS — N838 Other noninflammatory disorders of ovary, fallopian tube and broad ligament: Secondary | ICD-10-CM | POA: Insufficient documentation

## 2018-07-25 DIAGNOSIS — K219 Gastro-esophageal reflux disease without esophagitis: Secondary | ICD-10-CM | POA: Insufficient documentation

## 2018-07-25 DIAGNOSIS — N946 Dysmenorrhea, unspecified: Secondary | ICD-10-CM | POA: Insufficient documentation

## 2018-07-25 DIAGNOSIS — N920 Excessive and frequent menstruation with regular cycle: Secondary | ICD-10-CM | POA: Diagnosis present

## 2018-07-25 DIAGNOSIS — D259 Leiomyoma of uterus, unspecified: Secondary | ICD-10-CM | POA: Diagnosis not present

## 2018-07-25 DIAGNOSIS — E782 Mixed hyperlipidemia: Secondary | ICD-10-CM | POA: Diagnosis not present

## 2018-07-25 DIAGNOSIS — Z79899 Other long term (current) drug therapy: Secondary | ICD-10-CM | POA: Diagnosis not present

## 2018-07-25 DIAGNOSIS — F329 Major depressive disorder, single episode, unspecified: Secondary | ICD-10-CM | POA: Diagnosis not present

## 2018-07-25 HISTORY — PX: CYSTOSCOPY: SHX5120

## 2018-07-25 HISTORY — PX: TOTAL LAPAROSCOPIC HYSTERECTOMY WITH SALPINGECTOMY: SHX6742

## 2018-07-25 LAB — POCT PREGNANCY, URINE: PREG TEST UR: NEGATIVE

## 2018-07-25 SURGERY — HYSTERECTOMY, TOTAL, LAPAROSCOPIC, WITH SALPINGECTOMY
Anesthesia: General

## 2018-07-25 MED ORDER — KETOROLAC TROMETHAMINE 30 MG/ML IJ SOLN
INTRAMUSCULAR | Status: DC | PRN
  Administered 2018-07-25: 30 mg via INTRAVENOUS

## 2018-07-25 MED ORDER — OXYCODONE HCL 5 MG/5ML PO SOLN
5.0000 mg | Freq: Once | ORAL | Status: DC | PRN
  Filled 2018-07-25: qty 5

## 2018-07-25 MED ORDER — SCOPOLAMINE 1 MG/3DAYS TD PT72
MEDICATED_PATCH | TRANSDERMAL | Status: AC
  Filled 2018-07-25: qty 1

## 2018-07-25 MED ORDER — ONDANSETRON HCL 4 MG/2ML IJ SOLN
INTRAMUSCULAR | Status: AC
  Filled 2018-07-25: qty 2

## 2018-07-25 MED ORDER — ACETAMINOPHEN 325 MG PO TABS
650.0000 mg | ORAL_TABLET | ORAL | Status: DC | PRN
  Filled 2018-07-25: qty 2

## 2018-07-25 MED ORDER — SUGAMMADEX SODIUM 200 MG/2ML IV SOLN
INTRAVENOUS | Status: DC | PRN
  Administered 2018-07-25: 200 mg via INTRAVENOUS

## 2018-07-25 MED ORDER — OXYCODONE-ACETAMINOPHEN 5-325 MG PO TABS
ORAL_TABLET | ORAL | Status: AC
  Filled 2018-07-25: qty 2

## 2018-07-25 MED ORDER — ONDANSETRON HCL 4 MG/2ML IJ SOLN: INTRAMUSCULAR | Status: DC | PRN

## 2018-07-25 MED ORDER — ONDANSETRON HCL 4 MG/2ML IJ SOLN
INTRAMUSCULAR | Status: DC | PRN
  Administered 2018-07-25: 4 mg via INTRAVENOUS

## 2018-07-25 MED ORDER — ROCURONIUM BROMIDE 100 MG/10ML IV SOLN
INTRAVENOUS | Status: AC
  Filled 2018-07-25: qty 1

## 2018-07-25 MED ORDER — ENOXAPARIN SODIUM 40 MG/0.4ML ~~LOC~~ SOLN
40.0000 mg | SUBCUTANEOUS | Status: AC
  Administered 2018-07-25: 40 mg via SUBCUTANEOUS
  Filled 2018-07-25: qty 0.4

## 2018-07-25 MED ORDER — KETOROLAC TROMETHAMINE 30 MG/ML IJ SOLN
INTRAMUSCULAR | Status: AC
  Filled 2018-07-25: qty 1

## 2018-07-25 MED ORDER — DEXTROSE-NACL 5-0.45 % IV SOLN
INTRAVENOUS | Status: DC
  Administered 2018-07-25: 15:00:00 via INTRAVENOUS
  Filled 2018-07-25 (×2): qty 1000

## 2018-07-25 MED ORDER — SIMETHICONE 80 MG PO CHEW
80.0000 mg | CHEWABLE_TABLET | Freq: Four times a day (QID) | ORAL | Status: DC | PRN
  Administered 2018-07-25 – 2018-07-26 (×2): 80 mg via ORAL
  Filled 2018-07-25: qty 1

## 2018-07-25 MED ORDER — SODIUM CHLORIDE 0.9 % IV SOLN
2.0000 g | INTRAVENOUS | Status: AC
  Administered 2018-07-25: 2 g via INTRAVENOUS
  Filled 2018-07-25: qty 2

## 2018-07-25 MED ORDER — KETOROLAC TROMETHAMINE 30 MG/ML IJ SOLN
30.0000 mg | Freq: Four times a day (QID) | INTRAMUSCULAR | Status: DC
  Administered 2018-07-25 – 2018-07-26 (×3): 30 mg via INTRAVENOUS
  Filled 2018-07-25: qty 1

## 2018-07-25 MED ORDER — MENTHOL 3 MG MT LOZG
1.0000 | LOZENGE | OROMUCOSAL | Status: DC | PRN
  Filled 2018-07-25: qty 9

## 2018-07-25 MED ORDER — DULOXETINE HCL 60 MG PO CPEP
60.0000 mg | ORAL_CAPSULE | Freq: Every day | ORAL | Status: DC
  Administered 2018-07-25: 60 mg via ORAL
  Filled 2018-07-25: qty 1

## 2018-07-25 MED ORDER — ENOXAPARIN SODIUM 40 MG/0.4ML ~~LOC~~ SOLN
SUBCUTANEOUS | Status: AC
  Filled 2018-07-25: qty 0.4

## 2018-07-25 MED ORDER — MEPERIDINE HCL 25 MG/ML IJ SOLN
6.2500 mg | INTRAMUSCULAR | Status: DC | PRN
  Filled 2018-07-25: qty 1

## 2018-07-25 MED ORDER — ENOXAPARIN SODIUM 40 MG/0.4ML ~~LOC~~ SOLN
40.0000 mg | SUBCUTANEOUS | Status: DC
  Filled 2018-07-25: qty 0.4

## 2018-07-25 MED ORDER — HYDROMORPHONE HCL 1 MG/ML IJ SOLN
INTRAMUSCULAR | Status: AC
  Filled 2018-07-25: qty 1

## 2018-07-25 MED ORDER — FAMOTIDINE IN NACL 20-0.9 MG/50ML-% IV SOLN
20.0000 mg | Freq: Two times a day (BID) | INTRAVENOUS | Status: DC
  Administered 2018-07-25 – 2018-07-26 (×2): 20 mg via INTRAVENOUS
  Filled 2018-07-25 (×3): qty 50

## 2018-07-25 MED ORDER — LIDOCAINE HCL (CARDIAC) PF 100 MG/5ML IV SOSY
PREFILLED_SYRINGE | INTRAVENOUS | Status: DC | PRN
  Administered 2018-07-25: 100 mg via INTRAVENOUS

## 2018-07-25 MED ORDER — MIDAZOLAM HCL 5 MG/5ML IJ SOLN
INTRAMUSCULAR | Status: DC | PRN
  Administered 2018-07-25: 2 mg via INTRAVENOUS

## 2018-07-25 MED ORDER — FENTANYL CITRATE (PF) 100 MCG/2ML IJ SOLN
INTRAMUSCULAR | Status: AC
  Filled 2018-07-25: qty 2

## 2018-07-25 MED ORDER — HYDROMORPHONE HCL 1 MG/ML IJ SOLN
0.2500 mg | INTRAMUSCULAR | Status: DC | PRN
  Administered 2018-07-25 (×2): 0.25 mg via INTRAVENOUS
  Administered 2018-07-25: 0.5 mg via INTRAVENOUS
  Filled 2018-07-25: qty 0.5

## 2018-07-25 MED ORDER — LACTATED RINGERS IV SOLN
INTRAVENOUS | Status: DC
  Administered 2018-07-25 (×2): via INTRAVENOUS
  Filled 2018-07-25 (×2): qty 1000

## 2018-07-25 MED ORDER — ROCURONIUM BROMIDE 100 MG/10ML IV SOLN
INTRAVENOUS | Status: DC | PRN
  Administered 2018-07-25: 60 mg via INTRAVENOUS
  Administered 2018-07-25: 10 mg via INTRAVENOUS

## 2018-07-25 MED ORDER — KETOROLAC TROMETHAMINE 30 MG/ML IJ SOLN
30.0000 mg | Freq: Four times a day (QID) | INTRAMUSCULAR | Status: DC
  Filled 2018-07-25: qty 1

## 2018-07-25 MED ORDER — OXYCODONE HCL 5 MG PO TABS
5.0000 mg | ORAL_TABLET | Freq: Once | ORAL | Status: DC | PRN
  Filled 2018-07-25: qty 1

## 2018-07-25 MED ORDER — PROPOFOL 10 MG/ML IV BOLUS
INTRAVENOUS | Status: AC
  Filled 2018-07-25: qty 20

## 2018-07-25 MED ORDER — BUPIVACAINE HCL (PF) 0.25 % IJ SOLN
INTRAMUSCULAR | Status: DC | PRN
  Administered 2018-07-25: 10 mL

## 2018-07-25 MED ORDER — ESTRADIOL 0.1 MG/24HR TD PTWK
0.1000 mg | MEDICATED_PATCH | TRANSDERMAL | Status: DC
  Administered 2018-07-26: 0.1 mg via TRANSDERMAL
  Filled 2018-07-25 (×2): qty 1

## 2018-07-25 MED ORDER — SIMETHICONE 80 MG PO CHEW
CHEWABLE_TABLET | ORAL | Status: AC
  Filled 2018-07-25: qty 1

## 2018-07-25 MED ORDER — ALUM & MAG HYDROXIDE-SIMETH 200-200-20 MG/5ML PO SUSP
30.0000 mL | ORAL | Status: DC | PRN
  Filled 2018-07-25: qty 30

## 2018-07-25 MED ORDER — SODIUM CHLORIDE 0.9 % IV SOLN
INTRAVENOUS | Status: AC
  Filled 2018-07-25: qty 2

## 2018-07-25 MED ORDER — PROMETHAZINE HCL 25 MG/ML IJ SOLN
6.2500 mg | INTRAMUSCULAR | Status: DC | PRN
  Filled 2018-07-25: qty 1

## 2018-07-25 MED ORDER — SUGAMMADEX SODIUM 200 MG/2ML IV SOLN
INTRAVENOUS | Status: AC
  Filled 2018-07-25: qty 2

## 2018-07-25 MED ORDER — FENTANYL CITRATE (PF) 100 MCG/2ML IJ SOLN
INTRAMUSCULAR | Status: DC | PRN
  Administered 2018-07-25 (×3): 50 ug via INTRAVENOUS
  Administered 2018-07-25: 100 ug via INTRAVENOUS
  Administered 2018-07-25: 50 ug via INTRAVENOUS

## 2018-07-25 MED ORDER — SODIUM CHLORIDE 0.9 % IV SOLN
INTRAVENOUS | Status: DC | PRN
  Administered 2018-07-25: 60 mL

## 2018-07-25 MED ORDER — OXYCODONE-ACETAMINOPHEN 5-325 MG PO TABS
1.0000 | ORAL_TABLET | ORAL | Status: DC | PRN
  Administered 2018-07-25: 1 via ORAL
  Administered 2018-07-25 (×2): 2 via ORAL
  Administered 2018-07-25: 1 via ORAL
  Administered 2018-07-26: 2 via ORAL
  Filled 2018-07-25: qty 2

## 2018-07-25 MED ORDER — KETOROLAC TROMETHAMINE 30 MG/ML IJ SOLN
INTRAMUSCULAR | Status: AC
Start: 2018-07-25 — End: ?
  Filled 2018-07-25: qty 1

## 2018-07-25 MED ORDER — LIDOCAINE 2% (20 MG/ML) 5 ML SYRINGE
INTRAMUSCULAR | Status: AC
  Filled 2018-07-25: qty 5

## 2018-07-25 MED ORDER — PROPOFOL 10 MG/ML IV BOLUS
INTRAVENOUS | Status: DC | PRN
  Administered 2018-07-25: 200 mg via INTRAVENOUS

## 2018-07-25 MED ORDER — WHITE PETROLATUM EX OINT
TOPICAL_OINTMENT | CUTANEOUS | Status: AC
  Filled 2018-07-25: qty 5

## 2018-07-25 MED ORDER — OXYCODONE-ACETAMINOPHEN 5-325 MG PO TABS
ORAL_TABLET | ORAL | Status: AC
  Filled 2018-07-25: qty 1

## 2018-07-25 MED ORDER — MIDAZOLAM HCL 2 MG/2ML IJ SOLN
INTRAMUSCULAR | Status: AC
  Filled 2018-07-25: qty 2

## 2018-07-25 MED ORDER — SCOPOLAMINE 1 MG/3DAYS TD PT72
MEDICATED_PATCH | TRANSDERMAL | Status: DC | PRN
  Administered 2018-07-25: 1 via TRANSDERMAL

## 2018-07-25 MED ORDER — MORPHINE SULFATE (PF) 2 MG/ML IV SOLN
1.0000 mg | INTRAVENOUS | Status: DC | PRN
  Filled 2018-07-25: qty 1

## 2018-07-25 SURGICAL SUPPLY — 51 items
APPLICATOR ARISTA FLEXITIP XL (MISCELLANEOUS) IMPLANT
CABLE HIGH FREQUENCY MONO STRZ (ELECTRODE) IMPLANT
COVER BACK TABLE 80X110 HD (DRAPES) ×3 IMPLANT
COVER MAYO STAND STRL (DRAPES) ×3 IMPLANT
COVER SURGICAL LIGHT HANDLE (MISCELLANEOUS) IMPLANT
DERMABOND ADVANCED (GAUZE/BANDAGES/DRESSINGS) ×1
DERMABOND ADVANCED .7 DNX12 (GAUZE/BANDAGES/DRESSINGS) ×2 IMPLANT
DILATOR CANAL MILEX (MISCELLANEOUS) IMPLANT
DRSG COVADERM PLUS 2X2 (GAUZE/BANDAGES/DRESSINGS) IMPLANT
DRSG OPSITE POSTOP 3X4 (GAUZE/BANDAGES/DRESSINGS) IMPLANT
DURAPREP 26ML APPLICATOR (WOUND CARE) ×3 IMPLANT
GLOVE BIOGEL PI IND STRL 7.0 (GLOVE) ×4 IMPLANT
GLOVE BIOGEL PI INDICATOR 7.0 (GLOVE) ×2
GLOVE ECLIPSE 6.5 STRL STRAW (GLOVE) ×6 IMPLANT
GOWN STRL REUS W/TWL XL LVL3 (GOWN DISPOSABLE) ×6 IMPLANT
HEMOSTAT ARISTA ABSORB 3G PWDR (MISCELLANEOUS) IMPLANT
LIGASURE VESSEL 5MM BLUNT TIP (ELECTROSURGICAL) ×3 IMPLANT
NEEDLE INSUFFLATION 120MM (ENDOMECHANICALS) ×3 IMPLANT
NS IRRIG 1000ML POUR BTL (IV SOLUTION) ×3 IMPLANT
OCCLUDER COLPOPNEUMO (BALLOONS) ×3 IMPLANT
PACK LAPAROSCOPY BASIN (CUSTOM PROCEDURE TRAY) ×3 IMPLANT
PACK TRENDGUARD 450 HYBRID PRO (MISCELLANEOUS) ×2 IMPLANT
POUCH LAPAROSCOPIC INSTRUMENT (MISCELLANEOUS) ×3 IMPLANT
PROTECTOR NERVE ULNAR (MISCELLANEOUS) ×6 IMPLANT
SCISSORS LAP 5X35 DISP (ENDOMECHANICALS) IMPLANT
SET IRRIG TUBING LAPAROSCOPIC (IRRIGATION / IRRIGATOR) ×3 IMPLANT
SET IRRIG Y TYPE TUR BLADDER L (SET/KITS/TRAYS/PACK) ×3 IMPLANT
SET TRI-LUMEN FLTR TB AIRSEAL (TUBING) ×3 IMPLANT
SHEARS HARMONIC ACE PLUS 36CM (ENDOMECHANICALS) ×3 IMPLANT
SOLUTION ELECTROLUBE (MISCELLANEOUS) IMPLANT
SUT VIC AB 0 CT1 27 (SUTURE) ×2
SUT VIC AB 0 CT1 27XBRD ANBCTR (SUTURE) ×4 IMPLANT
SUT VICRYL 0 UR6 27IN ABS (SUTURE) IMPLANT
SUT VICRYL 4-0 PS2 18IN ABS (SUTURE) ×3 IMPLANT
SUT VLOC 180 0 9IN  GS21 (SUTURE) ×1
SUT VLOC 180 0 9IN GS21 (SUTURE) ×2 IMPLANT
SYR 10ML LL (SYRINGE) ×3 IMPLANT
SYR 50ML LL SCALE MARK (SYRINGE) ×6 IMPLANT
SYSTEM CARTER THOMASON II (TROCAR) IMPLANT
TIP UTERINE 5.1X6CM LAV DISP (MISCELLANEOUS) IMPLANT
TIP UTERINE 6.7X10CM GRN DISP (MISCELLANEOUS) IMPLANT
TIP UTERINE 6.7X6CM WHT DISP (MISCELLANEOUS) IMPLANT
TIP UTERINE 6.7X8CM BLUE DISP (MISCELLANEOUS) IMPLANT
TOWEL OR 17X24 6PK STRL BLUE (TOWEL DISPOSABLE) ×6 IMPLANT
TRAY FOLEY W/BAG SLVR 14FR (SET/KITS/TRAYS/PACK) ×3 IMPLANT
TRENDGUARD 450 HYBRID PRO PACK (MISCELLANEOUS) ×3
TROCAR ADV FIXATION 5X100MM (TROCAR) ×3 IMPLANT
TROCAR PORT AIRSEAL 5X120 (TROCAR) ×3 IMPLANT
TROCAR XCEL NON BLADE 8MM B8LT (ENDOMECHANICALS) ×3 IMPLANT
TROCAR XCEL NON-BLD 5MMX100MML (ENDOMECHANICALS) ×3 IMPLANT
WARMER LAPAROSCOPE (MISCELLANEOUS) ×3 IMPLANT

## 2018-07-25 NOTE — Interval H&P Note (Signed)
History and Physical Interval Note:  07/25/2018 9:49 AM  Anna Thomas  has presented today for surgery, with the diagnosis of menorrhagia, dysmenorrhea, fibroids  The various methods of treatment have been discussed with the patient and family. After consideration of risks, benefits and other options for treatment, the patient has consented to  Procedure(s) with comments: TOTAL LAPAROSCOPIC HYSTERECTOMY WITH SALPINGECTOMY possible BSO (Bilateral) - possible BSO CYSTOSCOPY possible cysto (N/A) - possible cysto as a surgical intervention .  The patient's history has been reviewed, patient examined, no change in status, stable for surgery.  I have reviewed the patient's chart and labs.  Questions were answered to the patient's satisfaction.     Megan Salon

## 2018-07-25 NOTE — Op Note (Signed)
07/25/2018  12:07 PM  PATIENT:  Anna Thomas  47 y.o. female  PRE-OPERATIVE DIAGNOSIS:  menorrhagia, dysmenorrhea, fibroids  POST-OPERATIVE DIAGNOSIS:  menorrhagia, dysmenorrhea, fibroids, left paratubal cyst  PROCEDURE:  Procedure(s): TOTAL LAPAROSCOPIC HYSTERECTOMY WITH SALPINGECTOMY possible BSO CYSTOSCOPY possible cysto  SURGEON:  Megan Salon  ASSISTANTS: Sumner Boast, MD   ANESTHESIA:   general  ESTIMATED BLOOD LOSS: 75 mL  BLOOD ADMINISTERED:none   FLUIDS: 1500cc LR  UOP: 250cc clear UOP  SPECIMEN:  Uterus, cervix, bilateral tubes and ovaries  DISPOSITION OF SPECIMEN:  PATHOLOGY  FINDINGS: normal pelvis, normal upper abdomen, boggy uterus with possible adenomyosis, fibroids  DESCRIPTION OF OPERATION: Patient is taken to the operating room. She is placed in the supine position. She is a running IV in place. Informed consent was present on the chart. SCDs on her lower extremities and functioning properly. Patient was positioned while she was awake.  Her legs were placed in the low lithotomy position in [place]. Her arms were tucked by the side.  General endotracheal anesthesia was administered by the anesthesia staff without difficulty.  Time out performed.    Chlora prep was then used to prep the abdomen and Betadine was used to prep the inner thighs, perineum and vagina. Once 3 minutes had past the patient was draped in a normal standard fashion. The legs were lifted to the high lithotomy position. The cervix was visualized by placing a heavy weighted speculum in the posterior aspect of the vagina and using a curved Deaver retractor to the retract anteriorly. The anterior lip of the cervix was grasped with single-tooth tenaculum.  The cervix sounded to 9 cm. Pratt dilators were used to dilate the cervix up to a #21. A RUMI uterine manipulator was obtained. A #8 disposable tip was placed on the RUMI manipulator as well as a 3.0, silver KOH ring. This was passed  through the cervix and the bulb of the disposable tip was inflated with 10 cc of normal saline. There was a good fit of the KOH ring around the cervix. The tenaculum was removed. There is also good manipulation of the uterus. The speculum and retractor were removed as well. A Foley catheter was placed to straight drain.  Clear urine was noted. Legs were lowered to the low lithotomy position and attention was turned the abdomen.  The umbilicus was everted.  A Veress needle was obtained. Syringe of sterile saline was placed on a open Veress needle.  This was passed into the umbilicus until just when the fluid started to drip.  Then low flow CO2 gas was attached the needle and the pneumoperitoneum was achieved without difficulty. Once 3.5 liters of gas was in the abdomen the Veress needle was removed and a 5 millimeter non-bladed Optiview trocar and port were passed directly to the abdomen. The laparoscope was then used to confirm intraperitoneal placement. A bulky appearing uterus was noted.  There was a paratubal cyst that was present from the left fallopian tube but was scarred to the posterior peritoneum.  Ovaries were normal.  Locations for RLQ, LLQ, and suprapubic ports were noted by transillumination of the abdominal wall.  0.25% marcaine was used to anesthetize the skin.  20mm skin incision was made in the RLQ and an AirSeal port was placed underdirect visualization of the laparoscope.  Then a 72mm skin incision was made and a 54mm nonbladed trochar and port was placed in the LLQ.  Finally, and 70mm skin incision was made about 4cm above  the pubic symphasis and an 63mm non-bladed port was placed with direct visualization of the laparoscope.  All trochars were removed.    Ureters were identifies.  Attention was turned to the left side. The fallopian tube was elevated and with sharp dissection, the paratubal cyst was excised from the posterior peritoneum without difficulty.  Then with uterus on stretch the left IP  ligament was serially clamped cauterized and incised using the ligasure device. Left round ligament was serially clamped cauterized and incised. The anterior and posterior peritoneum of the inferior leaf of the broad ligament were opened. The beginning of the baldder flap was created.  The bladder was taken down below the level of the KOH ring. The left uterine artery skeletonized and then just superior to the KOH ring this vessel was clamped and cauterized.    Attention was turned the right side.  The uterus was placed on stretch to the opposite side.  The right IP ligament was serially clamped cauterized and incised. Next the right round ligament was serially clamped cauterized and incised. The anterior posterior peritoneum of the inferiorly for the broad ligament were opened. The anterior peritoneum was carried across to the dissection on the left side. The remainder of the bladder flap was created using sharp dissection. The bladder was well below the level of the KOH ring. The left uterine artery skeletonized. Then the left uterine artery, above the level of the KOH ring, was serially clamped cauterized and incised. The uterus was devascularized at this point.  Attention was turned back to the left side and the uterine artery was again clamped, cauterized and incised to below the level of the KOH ring.  At this point, the colpotomy was performed a starting in the midline and using a harmonic scalpel with the inferior edge of the open blade  This was carried around a circumferential fashion until the vaginal mucosa was completely incised in the specimen was freed.  The specimen was then delivered to the vagina.  A vaginal occlusive device was used to maintain the pneumoperitoneum  Instruments were changed with a needle driver and Product/process development scientist.  Using a 9 inch V. lock suture, the cuff was closed by incorporating the anterior and posterior vaginal mucosa in each stitch. This was carried across all  the way to the left corner and a running fashion. Two stitches were brought back towards the midline and the suture was cut flush with the vagina. The needle was brought out the pelvis. The pelvis was irrigated. All pedicles were inspected. No bleeding was noted. CO2 pressure was decreased to 8 to ensure no bleeding along any pedicle or the cuff was noted.  Hemostasis was present.  Arista was placed along the pelvis.  At this point the procedure was completed.   The remaining instruments were removed.  The ports were removed under direct visualization of the laparoscope and the pneumoperitoneum was relieved.  The patient was taken out of Trendelenburg positioning.  Several deep breaths were given to the patient's trying to any gas the abdomen and finally the LLQ port was removed.  The skin was then closed with subcuticular stitches of 3-0 Vicryl. The skin was cleansed Dermabond was applied. Attention was then turned the vagina and the cuff was inspected. No bleeding was noted. The anterior posterior vaginal mucosa was incorporated in each stitch. The Foley catheter was removed.  Cystoscopy was performed.  No sutures or bladder injuries were noted.  Entire bladder was visualized including the bubble  on the dome of the bladder.  Ureters were noted with normal urine jets from each one was seen.  Foley was left out after the cystoscopic fluid was drained and cystoscope removed.  Sponge, lap, needle, initially counts were correct x2. Patient tolerated the procedure very well. She was awakened from anesthesia, extubated and taken to recovery in stable condition.   COUNTS:  YES  PLAN OF CARE: Transfer to PACU

## 2018-07-25 NOTE — Anesthesia Preprocedure Evaluation (Signed)
Anesthesia Evaluation  Patient identified by MRN, date of birth, ID band Patient awake    Reviewed: Allergy & Precautions, NPO status , Patient's Chart, lab work & pertinent test results  Airway Mallampati: II  TM Distance: >3 FB Neck ROM: Full    Dental no notable dental hx.    Pulmonary neg pulmonary ROS, sleep apnea ,    Pulmonary exam normal breath sounds clear to auscultation       Cardiovascular negative cardio ROS Normal cardiovascular exam Rhythm:Regular Rate:Normal     Neuro/Psych  Headaches, Depression negative neurological ROS  negative psych ROS   GI/Hepatic negative GI ROS, Neg liver ROS, GERD  ,  Endo/Other  negative endocrine ROS  Renal/GU negative Renal ROS  negative genitourinary   Musculoskeletal negative musculoskeletal ROS (+)   Abdominal (+) + obese,   Peds negative pediatric ROS (+)  Hematology negative hematology ROS (+)   Anesthesia Other Findings   Reproductive/Obstetrics negative OB ROS                             Anesthesia Physical Anesthesia Plan  ASA: II  Anesthesia Plan: General   Post-op Pain Management:    Induction: Intravenous  PONV Risk Score and Plan: 3 and Ondansetron, Dexamethasone and Midazolam  Airway Management Planned: Oral ETT  Additional Equipment:   Intra-op Plan:   Post-operative Plan: Extubation in OR  Informed Consent: I have reviewed the patients History and Physical, chart, labs and discussed the procedure including the risks, benefits and alternatives for the proposed anesthesia with the patient or authorized representative who has indicated his/her understanding and acceptance.   Dental advisory given  Plan Discussed with: CRNA  Anesthesia Plan Comments:         Anesthesia Quick Evaluation

## 2018-07-25 NOTE — H&P (Signed)
47 y.o. [id number] MarriedCaucasian female here for discussion of upcoming procedure due to menorrhagia with irregular bleeding.  She stopped the triprevafem due to persistent bleeding for a month.  When she stopped the OCPs she had a very heavy cycle that lasted for a week.  She is now on Loestrin and she is only have a little spotting.  She moved from [place] where she and her previous gynecologist discussed treatment of bleeding and dysmenorrhea (as well as headaches).  She had decided to proceed with hysterectomy but then needed to move.  She has decided at this time to proceed with definitive treatment.  She has seriously contemplated ovary removal as well due to menstrual related headaches.  She has decided she does want to proceed with ovary removal as well.  She understands increased CVD risk as well as increased risks for osteopenia/osteoporosis.  She would like to start on HRT after surgery, if possible.  Discussed with patient risks and benefits including but not limited to risks of increased risks of heart disease, MI, stroke, DVT, and breast cancer.  She is ok with these risks.  Ultrasound performed 06/02/18 showed uterus measuring 7.7 x 4.6 x 4.1cm with two fibroids with submucosal components.  Endometrial biopsy recommended today as well.   Procedure discussed with patient.  Hospital stay, recovery and pain management all discussed.  Risks discussed including but not limited to bleeding, 1% risk of receiving a  transfusion, infection, 3-4% risk of bowel/bladder/ureteral/vascular injury discussed as well as possible need for additional surgery if injury does occur discussed.  DVT/PE and rare risk of death discussed.  My actual complications with prior surgeries discussed.  Vaginal cuff dehiscence discussed.  Hernia formation discussed.  Positioning and incision locations discussed.  Patient aware if pathology abnormal she may need additional treatment.  All questions answered.    Ob Hx:    No LMP recorded.          Sexually active: Yes.   Birth control: oral contraceptives (estrogen/progesterone) Last pap: 05/02/18 Neg. HR HPV:neg  Last MMG: 05/09/18 [facility]. Normal  Tobacco: No       Past Surgical History:  Procedure Laterality Date  . URETHRAL SLING  09/14/2012   [facility]        Past Medical History:  Diagnosis Date  . Depression   . Infertility, female   . Migraines   . PCOS (polycystic ovarian syndrome)   . Urinary incontinence     Allergies: Dexamethasone sodium phosphate and Pseudoephedrine hcl        Current Outpatient Medications  Medication Sig Dispense Refill  . cetirizine (ZYRTEC) 10 MG tablet Take by mouth daily as needed.    . cholecalciferol (VITAMIN D) 1000 units tablet Take by mouth daily.    . Cyanocobalamin (VITAMIN B 12 PO) Take by mouth daily.    . Diclofenac Potassium (ZIPSOR) 25 MG CAPS Take 1 capsule by mouth daily as needed.    . DULoxetine (CYMBALTA) 60 MG capsule Take 1 capsule by mouth daily.    Marland Kitchen eletriptan (RELPAX) 40 MG tablet Take 1 tablet by mouth 2 (two) times daily as needed.    Marland Kitchen Galcanezumab-gnlm (EMGALITY [place]) Inject into the skin every 30 (thirty) days.    . Magnesium 100 MG CAPS Take by mouth.    . Multiple Vitamins-Minerals (SUPER THERA VITE M) TABS Take by mouth daily.    . Norethindrone Acetate-Ethinyl Estradiol (LOESTRIN 1.5/30, 21,) 1.5-30 MG-MCG tablet Take 1 tablet by mouth daily. 3 Package 0  .  OnabotulinumtoxinA (BOTOX IJ) every 3 (three) months.    . Probiotic Product (PROBIOTIC-10) CAPS Take by mouth daily.    . SUMAtriptan Succinate (SUMAVEL DOSEPRO) 6 MG/0.5ML SOTJ as needed.    . traZODone (DESYREL) 50 MG tablet Take 1 tablet by mouth at bedtime.     No current facility-administered medications for this visit.     ROS: Pertinent items noted in HPI and remainder of comprehensive ROS otherwise negative.  Exam:    BP 120/84 (BP Location: Right Arm, Patient Position:  Sitting, Cuff Size: Large)   Pulse 88   Resp 16   Ht 5' 3.25" (1.607 m)   Wt 191 lb (86.6 kg)   BMI 33.57 kg/m   General appearance: alert and cooperative Head: Normocephalic, without obvious abnormality, atraumatic Neck: no adenopathy, supple, symmetrical, trachea midline and thyroid not enlarged, symmetric, no tenderness/mass/nodules Lungs: clear to auscultation bilaterally Heart: regular rate and rhythm, S1, S2 normal, no murmur, click, rub or gallop Abdomen: soft, non-tender; bowel sounds normal; no masses,  no organomegaly Extremities: extremities normal, atraumatic, no cyanosis or edema Skin: Skin color, texture, turgor normal. No rashes or lesions Lymph nodes: Cervical, supraclavicular, and axillary nodes normal. no inguinal nodes palpated Neurologic: Grossly normal  Pelvic: External genitalia:  no lesions              Urethra: normal appearing urethra with no masses, tenderness or lesions              Bartholins and Skenes: Bartholin's, Urethra, Skene's normal                 Vagina: normal appearing vagina with normal color and discharge, no lesions              Cervix: normal appearance              Pap taken: No.        Bimanual Exam:  Uterus:  uterus is normal size, shape, consistency and nontender                                      Adnexa:    normal adnexa in size, nontender and no masses                                      Anus:  normal sphincter tone, no lesions  Endometrial biopsy recommended.  Discussed with patient.  Verbal and written consent obtained.   Procedure:  Speculum placed.  Cervix visualized and cleansed with betadine prep.  A single toothed tenaculum was applied to the anterior lip of the cervix.  Endometrial pipelle was advanced through the cervix into the endometrial cavity without difficulty.  Pipelle passed to 7cm.  Suction applied and pipelle removed with good tissue sample obtained.  Tenculum removed.  No bleeding noted.  Patient tolerated  procedure well.  A: Irregular bleeding Submucosal fibroids Dysmenorrhea Hormonal headaches    P:  TLH/BSO/cystoscopy planned Endometrial biopsy obtained today Medications/Vitamins reviewed.  Hysterectomy brochure given for pre and post op instructions.

## 2018-07-25 NOTE — Progress Notes (Signed)
RT called by staff from [facility] to notify of pt. staying overnight and having home CPAP to use this evening and requested RT to check pts. CPAP for overnite inpatient use, while assisting with set up after sterile water obtained by staff for humidifier, RT noted that pts. circuit was missing hose to nasal pillows elbow connector, pt. agreed to try nasal mask for this evening in place of nasal pillows, currently remains on room air an was tolerating nasal mask well, made both pt. and staff aware to notify if further assistance needed.

## 2018-07-25 NOTE — Anesthesia Procedure Notes (Signed)
Procedure Name: Intubation Date/Time: 07/25/2018 10:12 AM Performed by: Justice Rocher, CRNA Pre-anesthesia Checklist: Patient identified, Emergency Drugs available, Suction available and Patient being monitored Patient Re-evaluated:Patient Re-evaluated prior to induction Oxygen Delivery Method: Circle system utilized Preoxygenation: Pre-oxygenation with 100% oxygen Induction Type: IV induction Ventilation: Mask ventilation without difficulty Laryngoscope Size: Mac and 3 Grade View: Grade II Tube type: Oral Tube size: 7.0 mm Number of attempts: 1 Airway Equipment and Method: Stylet and Oral airway Placement Confirmation: ETT inserted through vocal cords under direct vision,  positive ETCO2 and breath sounds checked- equal and bilateral Secured at: 22 cm Tube secured with: Tape Dental Injury: Teeth and Oropharynx as per pre-operative assessment

## 2018-07-25 NOTE — Transfer of Care (Signed)
  Last Vitals:  Vitals Value Taken Time  BP 130/78 07/25/2018 12:05 PM  Temp 36.8 C 07/25/2018 12:05 PM  Pulse 81 07/25/2018 12:09 PM  Resp 9 07/25/2018 12:09 PM  SpO2 100 % 07/25/2018 12:09 PM  Vitals shown include unvalidated device data.  Last Pain:  Vitals:   07/25/18 1205  TempSrc:   PainSc: 6       Patients Stated Pain Goal: 5 (07/25/18 0855)  Immediate Anesthesia Transfer of Care Note  Patient: Shilo A Trupiano  Procedure(s) Performed: Procedure(s) (LRB): TOTAL LAPAROSCOPIC HYSTERECTOMY WITH SALPINGECTOMY possible BSO (Bilateral) CYSTOSCOPY possible cysto (N/A)  Patient Location: PACU  Anesthesia Type: General  Level of Consciousness: awake, alert  and oriented  Airway & Oxygen Therapy: Patient Spontanous Breathing and Patient connected to nasal cannula oxygen  Post-op Assessment: Report given to PACU RN and Post -op Vital signs reviewed and stable  Post vital signs: Reviewed and stable  Complications: No apparent anesthesia complications

## 2018-07-25 NOTE — Progress Notes (Signed)
Day of Surgery Procedure(s) (LRB): TOTAL LAPAROSCOPIC HYSTERECTOMY WITH SALPINGECTOMY possible BSO (Bilateral) CYSTOSCOPY possible cysto (N/A)  Subjective: Patient reports no nausea.  She is cramping but has good pain control with Percocet and Toradol.  Minimal spotting.  Voiding easily.     Objective: I have reviewed patient's vital signs, intake and output, medications and labs. Vitals:   07/25/18 1315 07/25/18 1338 07/25/18 1430 07/25/18 1610  BP: 134/85 124/80 127/79 120/78  Pulse: 74   96  Resp: 12 14 16 18   Temp:  97.8 F (36.6 C) 98.1 F (36.7 C) 98 F (36.7 C)  TempSrc:    Oral  SpO2: 94% 98% 98% 96%  Weight:      Height:       UOP:  2900  General: alert and cooperative Resp: clear to auscultation bilaterally Cardio: regular rate and rhythm, S1, S2 normal, no murmur, click, rub or gallop GI: soft, non-tender; bowel sounds normal; no masses,  no organomegaly and incision: clean, dry, intact and significant bruising at RLQ incision Extremities: extremities normal, atraumatic, no cyanosis or edema Vaginal Bleeding: minimal  Assessment: s/p Procedure(s) with comments: TOTAL LAPAROSCOPIC HYSTERECTOMY WITH SALPINGECTOMY possible BSO (Bilateral) - possible BSO CYSTOSCOPY possible cysto (N/A) - possible cysto: stable and progressing well  Plan: Advance diet Encourage ambulation Advance to PO medication CBC and BMP in am  LOS: 0 days    Megan Salon 07/25/2018, 6:10 PM

## 2018-07-25 NOTE — Progress Notes (Signed)
RT assisted pt. with departments CPAP-nasal mask placement, tolerated well ,staff remained at bedside, aware to notify if needed.

## 2018-07-26 ENCOUNTER — Encounter (HOSPITAL_BASED_OUTPATIENT_CLINIC_OR_DEPARTMENT_OTHER): Payer: Self-pay | Admitting: Obstetrics & Gynecology

## 2018-07-26 DIAGNOSIS — Z79899 Other long term (current) drug therapy: Secondary | ICD-10-CM | POA: Diagnosis not present

## 2018-07-26 DIAGNOSIS — N920 Excessive and frequent menstruation with regular cycle: Secondary | ICD-10-CM | POA: Diagnosis not present

## 2018-07-26 DIAGNOSIS — N838 Other noninflammatory disorders of ovary, fallopian tube and broad ligament: Secondary | ICD-10-CM | POA: Diagnosis not present

## 2018-07-26 DIAGNOSIS — F329 Major depressive disorder, single episode, unspecified: Secondary | ICD-10-CM | POA: Diagnosis not present

## 2018-07-26 DIAGNOSIS — N888 Other specified noninflammatory disorders of cervix uteri: Secondary | ICD-10-CM | POA: Diagnosis not present

## 2018-07-26 DIAGNOSIS — N8301 Follicular cyst of right ovary: Secondary | ICD-10-CM | POA: Diagnosis not present

## 2018-07-26 DIAGNOSIS — K219 Gastro-esophageal reflux disease without esophagitis: Secondary | ICD-10-CM | POA: Diagnosis not present

## 2018-07-26 DIAGNOSIS — N946 Dysmenorrhea, unspecified: Secondary | ICD-10-CM | POA: Diagnosis not present

## 2018-07-26 LAB — CBC
HCT: 35.8 % — ABNORMAL LOW (ref 36.0–46.0)
Hemoglobin: 11.6 g/dL — ABNORMAL LOW (ref 12.0–15.0)
MCH: 31.9 pg (ref 26.0–34.0)
MCHC: 32.4 g/dL (ref 30.0–36.0)
MCV: 98.4 fL (ref 78.0–100.0)
Platelets: 426 K/uL — ABNORMAL HIGH (ref 150–400)
RBC: 3.64 MIL/uL — ABNORMAL LOW (ref 3.87–5.11)
RDW: 13.1 % (ref 11.5–15.5)
WBC: 10.3 K/uL (ref 4.0–10.5)

## 2018-07-26 LAB — BASIC METABOLIC PANEL
Anion gap: 8 (ref 5–15)
BUN: 12 mg/dL (ref 6–20)
CO2: 29 mmol/L (ref 22–32)
CREATININE: 0.67 mg/dL (ref 0.44–1.00)
Calcium: 8.5 mg/dL — ABNORMAL LOW (ref 8.9–10.3)
Chloride: 102 mmol/L (ref 98–111)
GFR calc non Af Amer: >60 mL/min (ref >60)
Glucose, Bld: 100 mg/dL — ABNORMAL HIGH (ref 70–99)
Potassium: 4.1 mmol/L (ref 3.5–5.1)
Sodium: 139 mmol/L (ref 135–145)

## 2018-07-26 MED ORDER — OXYCODONE-ACETAMINOPHEN 5-325 MG PO TABS: 1.0000 | ORAL_TABLET | Freq: Four times a day (QID) | ORAL | 0 refills | Status: AC | PRN

## 2018-07-26 MED ORDER — OXYCODONE-ACETAMINOPHEN 5-325 MG PO TABS
ORAL_TABLET | ORAL | Status: AC
  Filled 2018-07-26: qty 2

## 2018-07-26 MED ORDER — DULOXETINE HCL 60 MG PO CPEP: 60.0000 mg | ORAL_CAPSULE | Freq: Every day | ORAL | Status: DC

## 2018-07-26 MED ORDER — KETOROLAC TROMETHAMINE 30 MG/ML IJ SOLN
INTRAMUSCULAR | Status: AC
  Filled 2018-07-26: qty 1

## 2018-07-26 MED ORDER — SIMETHICONE 80 MG PO CHEW
CHEWABLE_TABLET | ORAL | Status: AC
  Filled 2018-07-26: qty 1

## 2018-07-26 MED ORDER — ESTRADIOL 0.1 MG/24HR TD PTWK: 0.1000 mg | MEDICATED_PATCH | TRANSDERMAL | 12 refills | Status: DC

## 2018-07-26 MED ORDER — IBUPROFEN 800 MG PO TABS: 800.0000 mg | ORAL_TABLET | Freq: Three times a day (TID) | ORAL | 0 refills | Status: DC | PRN

## 2018-07-26 NOTE — Discharge Summary (Signed)
Physician Discharge Summary  Patient ID: Anna Thomas MRN: [id number] DOB/AGE: 01-19-1971 47 y.o.  Admit date: 07/25/2018 Discharge date: 07/26/2018  Admission Diagnoses:  Menorrhagia, migraines, fibroids, possible adenomyosis  Discharge Diagnoses:  Active Problems:   * No active hospital problems. *   Discharged Condition: stable  Hospital Course: Patient admitted through same day surgery.  She was taken to OR where TLH/BSO/cystoscopy were performed.  Surgical findings included boggy uterus and fibroids were present.  Surgery was uneventful.  EBL 75cc.  Foley catheter was removed before leaving OR.  Patient transferred to PACU where she was stable and then to 3rd floor for the remainder of her hospitalization.  During her post-op recovery, her vitals and stable and she was AF.  In evening of POD#0, she was able to transition to oral pain medications and regular diet.  She was able to ambulate and she had good pain control.  She was also able to void on her own.  Patient seen both in the evening of POD#0 and AM of POD#1.  In the AM of POD#1, she was without complaint.  Post op hb was 11.6, decreased from 13.1, pre-operatively.  At this point, patient was voiding, walking, having excellent pain control, had no nausea, and minimal vaginal bleeding.  She was ready for D/C.   Consults: None  Significant Diagnostic Studies: labs: post op hb 11.6  Treatments: surgery: TLH/BSO/Cystoscopy  Discharge Exam: Blood pressure 113/81, pulse 78, temperature 97.9 F (36.6 C), temperature source Oral, resp. rate 16, height 5' 4.75" (1.645 m), weight 192 lb 9.6 oz (87.4 kg), last menstrual period 07/03/2018, SpO2 93 %. General appearance: alert, cooperative and no distress Resp: clear to auscultation bilaterally Cardio: regular rate and rhythm, S1, S2 normal, no murmur, click, rub or gallop GI: normal findings: sot, appropriately tender, extensive bruising around RLQ incision, Normal BS  present Extremities: extremities normal, atraumatic, no cyanosis or edema  Disposition: Discharge disposition: 01-Home or Self Care       Discharge Instructions    Call MD for:   Complete by:  As directed    Heavy vaginal bleeding, like a menstrual cycle   Call MD for:  persistant nausea and vomiting   Complete by:  As directed    Call MD for:  redness, tenderness, or signs of infection (pain, swelling, redness, odor or green/yellow discharge around incision site)   Complete by:  As directed    Call MD for:  severe uncontrolled pain   Complete by:  As directed    Call MD for:  temperature >100.4   Complete by:  As directed    Diet general   Complete by:  As directed    Discharge wound care:   Complete by:  As directed    Use soap and water, only, on incisions   Driving Restrictions   Complete by:  As directed    May drive in one week   Increase activity slowly   Complete by:  As directed    Lifting restrictions   Complete by:  As directed    No heavy lifting (>15 pounds) for 4 weeks   May shower / Bathe   Complete by:  As directed    May shower on post-operative day 1.  May take a tub bath in one week.   Remove dressing in 24 hours   Complete by:  As directed    Sexual Activity Restrictions   Complete by:  As directed    Nothing in the  vagina for 8 weeks, intercourse/tampons/douching     Allergies as of 07/26/2018      Reactions   Dexamethasone Sodium Phosphate Nausea And Vomiting, Other (See Comments)   Went to ER. Felt like she was burning throughout her inside.  Burning inside my body   Pseudoephedrine Hcl Other (See Comments)   Heart racing, syncope      Medication List    STOP taking these medications   Norethindrone Acetate-Ethinyl Estradiol 1.5-30 MG-MCG tablet Commonly known as:  LOESTRIN 1.5/30 (21)     TAKE these medications   BOTOX IJ Inject 1 each into the muscle every 3 (three) months.   cetirizine 10 MG tablet Commonly known as:   ZYRTEC Take 10 mg by mouth daily as needed for allergies.   cholecalciferol 1000 units tablet Commonly known as:  VITAMIN D Take 1,000 Units by mouth daily.   DULoxetine 60 MG capsule Commonly known as:  CYMBALTA Take 1 capsule (60 mg total) by mouth daily.   EMGALITY 120 MG/ML Sosy Generic drug:  Galcanezumab-gnlm Inject 120 mg into the skin every 30 (thirty) days.   estradiol 0.1 mg/24hr patch Commonly known as:  CLIMARA - Dosed in mg/24 hr Place 1 patch (0.1 mg total) onto the skin once a week.   ibuprofen 800 MG tablet Commonly known as:  ADVIL,MOTRIN Take 1 tablet (800 mg total) by mouth every 8 (eight) hours as needed.   Magnesium 100 MG Caps Take 100 mg by mouth daily.   oxyCODONE-acetaminophen 5-325 MG tablet Commonly known as:  PERCOCET/ROXICET Take 1-2 tablets by mouth every 6 (six) hours as needed for up to 5 days for moderate pain or severe pain.   PROBIOTIC-10 Caps Take 1 capsule by mouth daily.   RELPAX 40 MG tablet Generic drug:  eletriptan Take 40 mg by mouth every 2 (two) hours as needed for migraine or headache.   SUMAVEL DOSEPRO 6 MG/0.5ML Sotj Generic drug:  SUMAtriptan Succinate Inject 6 mg into the muscle as needed (for migraine).   SUPER THERA VITE M Tabs Take 1 tablet by mouth daily.   traZODone 50 MG tablet Commonly known as:  DESYREL Take 50 mg by mouth at bedtime.   VITAMIN B 12 PO Take by mouth daily.   ZIPSOR 25 MG Caps Generic drug:  Diclofenac Potassium Take 25 mg by mouth daily as needed (for back and neck pain).            Discharge Care Instructions  (From admission, onward)        Start     Ordered   07/26/18 0000  Discharge wound care:    Comments:  Use soap and water, only, on incisions   07/26/18 0738       Signed: Megan Salon 07/26/2018, 7:39 AM

## 2018-07-26 NOTE — Progress Notes (Signed)
Pt up in hall and to BR frequently during night.  Tolerating po fluids well w/ nausea.  Pt c/o bloating and gas.  Mylicon chewable given earlier.  VSS.  Pt using IS.  SCDs in place.  Minimal drng on peripad and incisions clean, dry and intact.

## 2018-07-26 NOTE — Progress Notes (Signed)
1 Day Post-Op Procedure(s) (LRB): TOTAL LAPAROSCOPIC HYSTERECTOMY WITH SALPINGECTOMY possible BSO (Bilateral) CYSTOSCOPY possible cysto (N/A)  Subjective: Patient reports good pain control. No nausea.  Voiding well.  Has passed flatus.  Eating.  Walking.  Ready for d/c.  Objective: I have reviewed patient's vital signs, intake and output, medications and labs. Vitals:   07/25/18 2333 07/26/18 0400  BP: 106/75 113/81  Pulse: 76 78  Resp: 16 16  Temp: 98 F (36.7 C) 97.9 F (36.6 C)  SpO2: 96% 93%     General: alert and no distress Resp: clear to auscultation bilaterally Cardio: regular rate and rhythm, S1, S2 normal, no murmur, click, rub or gallop GI: soft, non-tender; bowel sounds normal; no masses,  no organomegaly and incision: clean, dry, intact and bruising esp around RLQ incision is significant Extremities: extremities normal, atraumatic, no cyanosis or edema Vaginal Bleeding: minimal  Assessment: s/p Procedure(s) with comments: TOTAL LAPAROSCOPIC HYSTERECTOMY WITH SALPINGECTOMY possible BSO (Bilateral) - possible BSO CYSTOSCOPY possible cysto (N/A) - possible cysto: stable and progressing well  Plan: Discharge home  LOS: 0 days    Megan Salon 07/26/2018, 7:32 AM

## 2018-07-26 NOTE — Anesthesia Postprocedure Evaluation (Signed)
Anesthesia Post Note  Patient: Taina A Pha  Procedure(s) Performed: TOTAL LAPAROSCOPIC HYSTERECTOMY WITH SALPINGECTOMY possible BSO (Bilateral ) CYSTOSCOPY possible cysto (N/A )     Patient location during evaluation: PACU Anesthesia Type: General Level of consciousness: awake and alert Pain management: pain level controlled Vital Signs Assessment: post-procedure vital signs reviewed and stable Respiratory status: spontaneous breathing, nonlabored ventilation and respiratory function stable Cardiovascular status: blood pressure returned to baseline and stable Postop Assessment: no apparent nausea or vomiting Anesthetic complications: no    Last Vitals:  Vitals:   07/25/18 2333 07/26/18 0400  BP: 106/75 113/81  Pulse: 76 78  Resp: 16 16  Temp: 36.7 C 36.6 C  SpO2: 96% 93%    Last Pain:  Vitals:   07/26/18 0646  TempSrc:   PainSc: [place]

## 2018-07-26 NOTE — Discharge Instructions (Signed)

## 2018-07-28 ENCOUNTER — Telehealth: Payer: Self-pay | Admitting: Obstetrics & Gynecology

## 2018-07-28 NOTE — Telephone Encounter (Signed)
I did call her today with her pathology report--Just FYI.  Thanks.

## 2018-07-28 NOTE — Telephone Encounter (Signed)
Spoke with patient.  She reports some itching on her abdomen. Has improved after applying cortisone cream and taking a zyrtec this morning (she reports she took care to avoid areas of incisions). Feels it may be related to pre-op abdomen cleansers or betadine. She denies any SOB or redness, swelling, warmth of skin, fever and/or drainage from incisions. Discussed using benadryl 25 mg PO at night to help with itching, but not to take with narcotics.   Offered office today for wound check and she declines. She is advised to call back for appointment if any increase in symptoms or concerns. Discussed incision care post op.   Advised Dr. Sabra Heck still needs to review pathology for final results.   Pt agreeable to plan and will call back prn and keep post op appointment for Monday as scheduled with Dr. Sabra Heck.   To Dr. Sabra Heck to review. Will close encounter.

## 2018-07-28 NOTE — Telephone Encounter (Signed)
Patient left voicemail stating that she was having some skin itching/irritation after hysterectomy. Patient is also requesting pathology results from hysterectomy.

## 2018-08-01 ENCOUNTER — Encounter: Payer: Self-pay | Admitting: Obstetrics & Gynecology

## 2018-08-01 ENCOUNTER — Other Ambulatory Visit: Payer: Self-pay | Admitting: Obstetrics & Gynecology

## 2018-08-01 ENCOUNTER — Ambulatory Visit (INDEPENDENT_AMBULATORY_CARE_PROVIDER_SITE_OTHER): Payer: BLUE CROSS/BLUE SHIELD | Admitting: Obstetrics & Gynecology

## 2018-08-01 ENCOUNTER — Other Ambulatory Visit: Payer: Self-pay

## 2018-08-01 VITALS — BP 152/98 | HR 89 | Ht 64.75 in | Wt 195.0 lb

## 2018-08-01 DIAGNOSIS — Z9889 Other specified postprocedural states: Secondary | ICD-10-CM

## 2018-08-01 MED ORDER — ESTRADIOL 0.1 MG/24HR TD PTTW: 1.0000 | MEDICATED_PATCH | TRANSDERMAL | 12 refills | Status: DC

## 2018-08-01 NOTE — Progress Notes (Signed)
Post Operative Visit  Procedure: Total laparoscopic Hysterectomy with Salpingectomy, BSO, Cystoscopy.  Days Post-op: 7  Subjective: Doing well except she is very bruised.  Energy is ok.  Taking naps.  Off narcotics.  Taking on ibuprofen.  Emptying bladder fine.  Having bowel movements but having some constipation and taking stool softeners.  Had a few days of spotting.    Using estradiol patch and hasn't changed it today.  Wanted to see if I was going to change dosing modality.    Objective: BP (!) 152/98 (BP Location: Left Arm, Patient Position: Sitting)   Pulse 89   Ht 5' 4.75" (1.645 m)   Wt 195 lb (88.5 kg)   LMP 07/03/2018 (Exact Date) Comment: Hysterectomy 07/25/2018  BMI 32.70 kg/m   EXAM General: alert and no distress Resp: clear to auscultation bilaterally Cardio: regular rate and rhythm, S1, S2 normal, no murmur, click, rub or gallop GI: soft, non-tender; bowel sounds normal; no masses,  no organomegaly and incision: clean, dry, intact and extensive bruising around RLQ and suprapubic incisions, improving however Extremities: extremities normal, atraumatic, no cyanosis or edema Vaginal Bleeding: none  Assessment: s/p TLH/BSO/cystoscopy On HRT now  Plan: Recheck 5 weeks Change rx to vivelle dot 0.1mg  patches twice weekly.  Rx to pharmacy.

## 2018-08-08 ENCOUNTER — Ambulatory Visit: Payer: BLUE CROSS/BLUE SHIELD | Admitting: Internal Medicine

## 2018-08-21 ENCOUNTER — Other Ambulatory Visit: Payer: Self-pay | Admitting: Obstetrics & Gynecology

## 2018-08-23 ENCOUNTER — Telehealth: Payer: Self-pay | Admitting: Obstetrics & Gynecology

## 2018-08-23 ENCOUNTER — Other Ambulatory Visit: Payer: Self-pay

## 2018-08-23 NOTE — Telephone Encounter (Signed)
Patient states she is still waiting to hear back from [place] about an issue she is having.

## 2018-08-23 NOTE — Telephone Encounter (Signed)
Spoke with patient.  1. Patient asking if additional  f/u with urology is needed for  "large ureters" noted during surgery?   2. S/p TLH 07/25/18. Itching has improved with cortisone cream. Dermabond came off of incision at umbilicus. Incision is closed, no d/c, redness or swelling. Patient reports a "string or stitch" outside of the incision, not bothersome. Recommended patient not pull on "string", Dr. Sabra Heck can evaluate at f/u on 9/13. Offered earlier appt, patient declined at this time.   Advised will review with Dr. Sabra Heck and return call.   Dr. Sabra Heck -please advise on urology f/u?

## 2018-08-24 NOTE — Telephone Encounter (Signed)
Please let her know I did talk with Dr. Matilde Sprang, urologist, and he did not suggest additional testing except to test a urinalysis for blood.  I will do this when she comes for her follow up appt.  I will explain everything he said in detail when she returns for her post op follow up.  Thanks.

## 2018-08-25 NOTE — Telephone Encounter (Signed)
Spoke with patient, advised as seen below per Dr. Miller. Patient verbalizes understanding and is agreeable.   Encounter closed.  

## 2018-09-02 ENCOUNTER — Ambulatory Visit (INDEPENDENT_AMBULATORY_CARE_PROVIDER_SITE_OTHER): Payer: BLUE CROSS/BLUE SHIELD | Admitting: Obstetrics & Gynecology

## 2018-09-02 ENCOUNTER — Encounter: Payer: Self-pay | Admitting: Obstetrics & Gynecology

## 2018-09-02 VITALS — BP 110/68 | HR 68 | Resp 16 | Ht 64.75 in | Wt 198.6 lb

## 2018-09-02 DIAGNOSIS — N2882 Megaloureter: Secondary | ICD-10-CM | POA: Diagnosis not present

## 2018-09-02 DIAGNOSIS — Z9889 Other specified postprocedural states: Secondary | ICD-10-CM

## 2018-09-02 DIAGNOSIS — R3129 Other microscopic hematuria: Secondary | ICD-10-CM

## 2018-09-02 MED ORDER — ESTRADIOL 0.1 MG/24HR TD PTTW: 1.0000 | MEDICATED_PATCH | TRANSDERMAL | 4 refills | Status: DC
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
## 2018-09-03 LAB — URINALYSIS, MICROSCOPIC ONLY: Casts: NONE SEEN /lpf

## 2018-09-06 NOTE — Addendum Note (Signed)
Addended by: Megan Salon on: 09/06/2018 06:00 PM   Modules accepted: Orders

## 2018-09-13 DIAGNOSIS — R3121 Asymptomatic microscopic hematuria: Secondary | ICD-10-CM | POA: Diagnosis not present

## 2018-09-23 ENCOUNTER — Ambulatory Visit (INDEPENDENT_AMBULATORY_CARE_PROVIDER_SITE_OTHER): Payer: BLUE CROSS/BLUE SHIELD | Admitting: Internal Medicine

## 2018-09-23 ENCOUNTER — Encounter: Payer: Self-pay | Admitting: Internal Medicine

## 2018-09-23 VITALS — BP 128/78 | HR 80 | Ht 64.75 in | Wt 189.8 lb

## 2018-09-23 DIAGNOSIS — Z8249 Family history of ischemic heart disease and other diseases of the circulatory system: Secondary | ICD-10-CM

## 2018-09-23 DIAGNOSIS — I119 Hypertensive heart disease without heart failure: Secondary | ICD-10-CM

## 2018-09-23 NOTE — Progress Notes (Addendum)
Cardiology Office Note   Date:  09/23/2018   ID:  Anna Thomas, DOB 1971/11/19, MRN [id number]  PCP:  Billie Ruddy, MD  Cardiologist:   Dorris Carnes, MD   Pt referred by Lockie Pares for screening of HCM   History of Present Illness: Anna Thomas is a 47 y.o. female with no prior cardioac history    She has a sister in [place] who was recently hospitalized for sepsis   Found to have hypertrophic cardiomyopathy   Cardiologist there told family members they needed to be screened  The pt is active   Denies CP   No SOB   No palpitations   No dizziness No FHx of sudden cardiac death      Current Meds  Medication Sig  . cetirizine (ZYRTEC) 10 MG tablet Take 10 mg by mouth daily as needed for allergies.   . cholecalciferol (VITAMIN D) 1000 units tablet Take 1,000 Units by mouth daily.   . Cyanocobalamin (VITAMIN B 12 PO) Take by mouth daily.  . Diclofenac Potassium (ZIPSOR) 25 MG CAPS Take 25 mg by mouth daily as needed (for back and neck pain).   . DULoxetine (CYMBALTA) 60 MG capsule Take 1 capsule (60 mg total) by mouth daily.  Marland Kitchen eletriptan (RELPAX) 40 MG tablet Take 40 mg by mouth every 2 (two) hours as needed for migraine or headache.   . estradiol (VIVELLE-DOT) 0.1 MG/24HR patch Place 1 patch (0.1 mg total) onto the skin 2 (two) times a week.  . Galcanezumab-gnlm (EMGALITY) 120 MG/ML SOSY Inject 120 mg into the skin every 30 (thirty) days.   Marland Kitchen ibuprofen (ADVIL,MOTRIN) 800 MG tablet Take 1 tablet (800 mg total) by mouth every 8 (eight) hours as needed.  . Magnesium 100 MG CAPS Take 100 mg by mouth daily.   . Multiple Vitamins-Minerals (SUPER THERA VITE M) TABS Take 1 tablet by mouth daily.   . OnabotulinumtoxinA (BOTOX IJ) Inject 1 each into the muscle every 3 (three) months.   . Probiotic Product (PROBIOTIC-10) CAPS Take 1 capsule by mouth daily.   . SUMAtriptan Succinate (SUMAVEL DOSEPRO) 6 MG/0.5ML SOTJ Inject 6 mg into the muscle as needed (for migraine).   . traZODone (DESYREL)  50 MG tablet Take 50 mg by mouth at bedtime.      Allergies:   Dexamethasone sodium phosphate and Pseudoephedrine hcl   Past Medical History:  Diagnosis Date  . Anemia   . Depression   . Elevated blood-pressure reading without diagnosis of hypertension 03/13/2017  . Exercise-induced asthma    Childhood  . Female stress incontinence 05/02/2018  . Fibroids, submucosal   . GERD (gastroesophageal reflux disease)    history after decadron reaction  . Infertility, female   . Insomnia   . Menorrhagia 05/02/2018  . Migraine without aura and without status migrainosus, not intractable 03/13/2017  . Migraines   . Mixed hyperlipidemia 03/13/2017  . PCOS (polycystic ovarian syndrome)   . Seasonal allergies   . Sleep apnea    CPAP only when sick    Past Surgical History:  Procedure Laterality Date  . CYSTOSCOPY N/A 07/25/2018   Procedure: CYSTOSCOPY possible cysto;  Surgeon: Megan Salon, MD;  Location: [facility];  Service: Gynecology;  Laterality: N/A;  possible cysto  . TOTAL LAPAROSCOPIC HYSTERECTOMY WITH SALPINGECTOMY Bilateral 07/25/2018   Procedure: TOTAL LAPAROSCOPIC HYSTERECTOMY WITH SALPINGECTOMY possible BSO;  Surgeon: Megan Salon, MD;  Location: [facility];  Service: Gynecology;  Laterality:  Bilateral;  possible BSO  . URETHRAL SLING  09/14/2012   SPARC  . WISDOM TOOTH EXTRACTION       Social History:  The patient  reports that she has never smoked. She has never used smokeless tobacco. She reports that she drinks about 2.0 - 3.0 standard drinks of alcohol per week. She reports that she does not use drugs.   Family History:  The patient's family history includes Cardiomyopathy in her sister; Ovarian cysts in her sister; Prostate cancer in her father; Skin cancer in her father.    ROS:  Please see the history of present illness. All other systems are reviewed and  Negative to the above problem except as noted.    PHYSICAL EXAM: VS:  BP  128/78   Pulse 80   Ht 5' 4.75" (1.645 m)   Wt 189 lb 12.8 oz (86.1 kg)   LMP 07/03/2018 (Exact Date) Comment: Hysterectomy 07/25/2018  BMI 31.83 kg/m   GEN: Well nourished, well developed, in no acute distress  HEENT: normal  Neck: no JVD, carotid bruits, or masses Cardiac: RRR; no murmurs, rubs, or gallops,no edema  Respiratory:  clear to auscultation bilaterally, normal work of breathing GI: soft, nontender, nondistended, + BS  No hepatomegaly  MS: no deformity Moving all extremities   Skin: warm and dry, no rash Neuro:  Strength and sensation are intact Psych: euthymic mood, full affect   EKG:  EKG is ordered today.  SR 80 bpm     Lipid Panel    Component Value Date/Time   CHOL 200 05/20/2018 0742   TRIG 164.0 (H) 05/20/2018 0742   HDL 69.50 05/20/2018 0742   CHOLHDL 3 05/20/2018 0742   VLDL 32.8 05/20/2018 0742   LDLCALC 98 05/20/2018 0742      Wt Readings from Last 3 Encounters:  09/23/18 189 lb 12.8 oz (86.1 kg)  09/02/18 198 lb 9.6 oz (90.1 kg)  08/01/18 195 lb (88.5 kg)      ASSESSMENT AND PLAN:  1   Cardiac screening   Pt with sister who was recently diagnosed with hypertrophic cardiomyopathy   Per patient, genetic testing was unrevealing    The pt is asymptomatic   EKG is normal voltage     I recomm an echo to eval cardiac anatomy    F/U based on results I also asked that she try to get more details on sister  Pt has 4 children (2 sets of twins)   There work up depends on findings of echo    2   HCM   Lipids OK        Current medicines are reviewed at length with the patient today.  The patient does not have concerns regarding medicines.  Signed, Dorris Carnes, MD  09/23/2018 10:26 AM    [address], [place], [place]  [address] Phone: [phone]; Fax: [phone]

## 2018-09-23 NOTE — Patient Instructions (Signed)
Medication Instructions:  Your physician recommends that you continue on your current medications as directed. Please refer to the Current Medication list given to you today.  If you need a refill on your cardiac medications before your next appointment, please call your pharmacy.   Lab work: NONE If you have labs (blood work) drawn today and your tests are completely normal, you will receive your results only by: Marland Kitchen MyChart Message (if you have MyChart) OR . A paper copy in the mail If you have any lab test that is abnormal or we need to change your treatment, we will call you to review the results.  Testing/Procedures: Your physician has requested that you have an echocardiogram. Echocardiography is a painless test that uses sound waves to create images of your heart. It provides your doctor with information about the size and shape of your heart and how well your heart's chambers and valves are working. This procedure takes approximately one hour. There are no restrictions for this procedure.   Follow-Up: At [facility], you and your health needs are our priority.  As part of our continuing mission to provide you with exceptional heart care, we have created designated Provider Care Teams.  These Care Teams include your primary Cardiologist (physician) and Advanced Practice Providers (APPs -  Physician Assistants and Nurse Practitioners) who all work together to provide you with the care you need, when you need it.  Follow up with your physician will depend on test results.   Any Other Special Instructions Will Be Listed Below (If Applicable). NONE

## 2018-09-27 DIAGNOSIS — R3129 Other microscopic hematuria: Secondary | ICD-10-CM | POA: Diagnosis not present

## 2018-09-27 DIAGNOSIS — R3121 Asymptomatic microscopic hematuria: Secondary | ICD-10-CM | POA: Diagnosis not present

## 2018-09-29 DIAGNOSIS — R3121 Asymptomatic microscopic hematuria: Secondary | ICD-10-CM | POA: Diagnosis not present

## 2018-10-04 ENCOUNTER — Ambulatory Visit (HOSPITAL_COMMUNITY): Payer: BLUE CROSS/BLUE SHIELD | Attending: Cardiovascular Disease

## 2018-10-04 ENCOUNTER — Other Ambulatory Visit: Payer: Self-pay

## 2018-10-04 DIAGNOSIS — I119 Hypertensive heart disease without heart failure: Secondary | ICD-10-CM | POA: Diagnosis not present

## 2018-10-04 DIAGNOSIS — Z8249 Family history of ischemic heart disease and other diseases of the circulatory system: Secondary | ICD-10-CM | POA: Diagnosis not present

## 2018-10-13 DIAGNOSIS — L281 Prurigo nodularis: Secondary | ICD-10-CM | POA: Diagnosis not present

## 2018-10-13 DIAGNOSIS — Z23 Encounter for immunization: Secondary | ICD-10-CM | POA: Diagnosis not present

## 2018-10-13 DIAGNOSIS — L739 Follicular disorder, unspecified: Secondary | ICD-10-CM | POA: Diagnosis not present

## 2018-10-13 DIAGNOSIS — L738 Other specified follicular disorders: Secondary | ICD-10-CM | POA: Diagnosis not present

## 2018-10-26 DIAGNOSIS — N390 Urinary tract infection, site not specified: Secondary | ICD-10-CM | POA: Diagnosis not present

## 2018-10-26 DIAGNOSIS — B9620 Unspecified Escherichia coli [E. coli] as the cause of diseases classified elsewhere: Secondary | ICD-10-CM | POA: Diagnosis not present

## 2018-10-26 DIAGNOSIS — R310 Gross hematuria: Secondary | ICD-10-CM | POA: Diagnosis not present

## 2018-11-09 DIAGNOSIS — R310 Gross hematuria: Secondary | ICD-10-CM | POA: Diagnosis not present

## 2018-12-21 HISTORY — PX: FOOT SURGERY: SHX648

## 2018-12-26 DIAGNOSIS — L738 Other specified follicular disorders: Secondary | ICD-10-CM | POA: Diagnosis not present

## 2018-12-26 DIAGNOSIS — L814 Other melanin hyperpigmentation: Secondary | ICD-10-CM | POA: Diagnosis not present

## 2018-12-26 DIAGNOSIS — L281 Prurigo nodularis: Secondary | ICD-10-CM | POA: Diagnosis not present

## 2018-12-26 DIAGNOSIS — L309 Dermatitis, unspecified: Secondary | ICD-10-CM | POA: Diagnosis not present

## 2019-03-28 ENCOUNTER — Telehealth: Payer: Self-pay | Admitting: Obstetrics & Gynecology

## 2019-03-28 MED ORDER — ESTRADIOL 1 MG PO TABS: 1.0000 mg | ORAL_TABLET | Freq: Every day | ORAL | 1 refills | Status: DC

## 2019-03-28 NOTE — Telephone Encounter (Signed)
Spoke with patient. Advised of message as seen below from [place]. Patient verbalizes understanding. Rx for Estradiol 1 mg daily #30 1RF sent to pharmacy on file. Encounter closed.

## 2019-03-28 NOTE — Telephone Encounter (Signed)
Patient is currently using Estradiol 0.1 mg patch twice weekly. Patient has had a TLH with salpingectomy BSO Cystoscopy. Reports the patch is irritating her skin and she would like to switch to an oral alternative. [facility] Please advise.

## 2019-03-28 NOTE — Telephone Encounter (Signed)
Patient states estrogen patch is irritating skin and would like oral alternative. Please advise.   [facility]

## 2019-03-28 NOTE — Telephone Encounter (Signed)
Change her to estrace 1 mg daily, she should call and check in a few weeks after starting it to make sure this dose is adequate for her. Please call in 30 with one refill

## 2019-04-25 ENCOUNTER — Telehealth: Payer: Self-pay | Admitting: *Deleted

## 2019-04-25 NOTE — Telephone Encounter (Signed)
Patient want to speak with nurse abut her estradiol medication.

## 2019-04-25 NOTE — Telephone Encounter (Signed)
I'd like to consider using Gabapentin first before increasing estrogen dosage.  She may have questions about this.  Could do Webex on Thursday afternoon to address any concerns before starting and to review how dosage with gabapentin is done for hot flashes.

## 2019-04-25 NOTE — Telephone Encounter (Signed)
Spoke with patient. Calling to provide update on change from estradiol patch to estradiol PO. Patient was changed to estradiol 1mg  PO tab due to skin irritation from patch, was to call with update. Has been taking estradiol 1mg  PO daily for approximately 20 days, is working well but is now experiencing increase in hot flashes at night, was experiencing this with patch. Asking if increase in dosage would be appropriate or recommendations. Advised I will review with Dr. Sabra Heck and return call with recommendations. Patient agreeable.   Dr. Sabra Heck  -please advise.

## 2019-04-26 ENCOUNTER — Other Ambulatory Visit: Payer: Self-pay | Admitting: Obstetrics and Gynecology

## 2019-04-26 NOTE — Telephone Encounter (Signed)
Spoke with patient, advised as seen below per Dr. Sabra Heck. Patient request WebEx visit, scheduled for 5/7 at 3pm with Dr. Sabra Heck.   Routing to provider for final review. Patient is agreeable to disposition. Will close encounter.

## 2019-04-26 NOTE — Telephone Encounter (Addendum)
Medication refill request: estradiol 1mg  Last AEX:  05/02/18 SM Next AEX: 07/28/19  Last MMG (if hormonal medication request): 05/31/18 BIRADS1:Neg f/u 1 year  Refill authorized: 03/28/19 #[id number]. Requesting 90 day refill. Please advise.

## 2019-04-27 ENCOUNTER — Encounter: Payer: Self-pay | Admitting: Obstetrics & Gynecology

## 2019-04-27 ENCOUNTER — Ambulatory Visit (INDEPENDENT_AMBULATORY_CARE_PROVIDER_SITE_OTHER): Payer: BLUE CROSS/BLUE SHIELD | Admitting: Obstetrics & Gynecology

## 2019-04-27 DIAGNOSIS — R232 Flushing: Secondary | ICD-10-CM | POA: Diagnosis not present

## 2019-04-27 MED ORDER — GABAPENTIN 100 MG PO CAPS: ORAL_CAPSULE | ORAL | 0 refills | Status: DC

## 2019-04-27 NOTE — Progress Notes (Signed)
Virtual Visit via Video Note  I connected with Harlo A Brandner on 04/27/19 at  3:00 PM EDT by a video enabled telemedicine application and verified that I am speaking with the correct person using two identifiers.  Location: Patient: home Provider: office   I discussed the limitations of evaluation and management by telemedicine and the availability of in person appointments. The patient expressed understanding and agreed to proceed.  History of Present Illness: 48 yo G2P2 MWF with hx of TLH 8/19 now with hot flashes.  She was on vivelle dot initially but switched to estradiol 1mg  q day.  Having increased hot flashes as night and is desirous of changing medication or discussing options.  She does have significant hx of migraines.  Is having regular botox injections with neurologist in [place].  Was seen yesterday.  She is aware I am anxious about increasing estrogen dosage due to migraine hx.  She feels the same.  We discussed changing dosage to 0.5mg  bid or adding gabapentin.  Off label use of medication for hot flashes discussed as well as titration.  She takes sleeping medication at night so knows to stop this.  Questions answered.   Observations/Objective: WNWD WF NAD  Assessment and Plan: 48 yo G2P2 WF with hot flashes  Follow Up Instructions: She is going to first try to split her HRT dosage.  If no improvement in 1-2 weeks, she will start gabapentin 100mg  nightly x 4 nights and increase every four nights up to 300mg .  Knows to call with concerns and to give update in a few weeks.  Rx to pharmacy today.   I provided 17 minutes of non-face-to-face time during this encounter.   Megan Salon, MD

## 2019-05-28 ENCOUNTER — Other Ambulatory Visit: Payer: Self-pay | Admitting: Obstetrics & Gynecology

## 2019-06-15 ENCOUNTER — Telehealth: Payer: Self-pay | Admitting: Obstetrics & Gynecology

## 2019-06-15 NOTE — Telephone Encounter (Signed)
Gabapentin Rx d/c.   Rx sent for Estradiol 1 mg tab #90/0RF to pharmacy on 04/27/19.   Next AEX 07/28/19  Routing to Dr. Lestine Box.   Encounter closed.

## 2019-06-15 NOTE — Telephone Encounter (Signed)
Patient is calling to let Dr. Sabra Heck know that splitting the Estradiol 1MG  tablet, half in the morning and half at night is working well for her hot flashes. Patient stated that she is no longer taking Gabapentin 100MG .

## 2019-07-21 DIAGNOSIS — M71571 Other bursitis, not elsewhere classified, right ankle and foot: Secondary | ICD-10-CM | POA: Diagnosis not present

## 2019-07-21 DIAGNOSIS — M7731 Calcaneal spur, right foot: Secondary | ICD-10-CM | POA: Diagnosis not present

## 2019-07-21 DIAGNOSIS — M79671 Pain in right foot: Secondary | ICD-10-CM | POA: Diagnosis not present

## 2019-07-21 DIAGNOSIS — M722 Plantar fascial fibromatosis: Secondary | ICD-10-CM | POA: Diagnosis not present

## 2019-07-25 ENCOUNTER — Other Ambulatory Visit: Payer: Self-pay

## 2019-07-26 ENCOUNTER — Other Ambulatory Visit: Payer: Self-pay | Admitting: Obstetrics & Gynecology

## 2019-07-26 DIAGNOSIS — F4322 Adjustment disorder with anxiety: Secondary | ICD-10-CM | POA: Diagnosis not present

## 2019-07-26 NOTE — Telephone Encounter (Signed)
Medication refill request: estradiol 1mg  Last AEX:  05-02-18 Next AEX: 07-28-2019 Last MMG (if hormonal medication request): 05-31-18 category c density birads 1:neg Refill authorized: please refill until aex if appropriate

## 2019-07-28 ENCOUNTER — Encounter: Payer: Self-pay | Admitting: Obstetrics & Gynecology

## 2019-07-28 ENCOUNTER — Other Ambulatory Visit: Payer: Self-pay

## 2019-07-28 ENCOUNTER — Ambulatory Visit (INDEPENDENT_AMBULATORY_CARE_PROVIDER_SITE_OTHER): Payer: BC Managed Care – PPO | Admitting: Obstetrics & Gynecology

## 2019-07-28 VITALS — BP 126/88 | HR 96 | Temp 97.4°F | Ht 62.75 in | Wt 177.0 lb

## 2019-07-28 DIAGNOSIS — Z1211 Encounter for screening for malignant neoplasm of colon: Secondary | ICD-10-CM

## 2019-07-28 DIAGNOSIS — Z113 Encounter for screening for infections with a predominantly sexual mode of transmission: Secondary | ICD-10-CM | POA: Diagnosis not present

## 2019-07-28 DIAGNOSIS — Z01419 Encounter for gynecological examination (general) (routine) without abnormal findings: Secondary | ICD-10-CM | POA: Diagnosis not present

## 2019-07-28 MED ORDER — ESTRADIOL 1 MG PO TABS: 1.0000 mg | ORAL_TABLET | Freq: Every day | ORAL | 4 refills | Status: DC

## 2019-07-28 NOTE — Progress Notes (Signed)
48 y.o. G65P2004 Married White or Caucasian female here for annual exam.  She did see urology last year.  Has been evaluated for dilated ureter and for blood in urine.  CT was negative.    She and her husband separated.  She would like STD testing done.    Did see Dr. Harrington Challenger last year.  Follow up in 3 years was recommended.    Patient's last menstrual period was 07/03/2018 (exact date).          Sexually active: Yes.    The current method of family planning is status post hysterectomy.    Exercising: Yes.    zumba, walking Smoker:  no  Health Maintenance: Pap:  05/02/18 neg. HR HPV:neg  History of abnormal Pap:  no MMG:  05/31/18 left breast BIRADS1:neg. F/u 1 year.  Pt will plan this later this year.   Colonoscopy:  Never.  ACS guidelines reviewed.   BMD:  Consider doing this in her mid 47's. TDaP:  Unsure.   Screening Labs: STD panel    reports that she has never smoked. She has never used smokeless tobacco. She reports current alcohol use of about 2.0 standard drinks of alcohol per week. She reports that she does not use drugs.  Past Medical History:  Diagnosis Date  . Anemia   . Depression   . Elevated blood-pressure reading without diagnosis of hypertension 03/13/2017  . Exercise-induced asthma    Childhood  . Female stress incontinence 05/02/2018  . Fibroids, submucosal   . GERD (gastroesophageal reflux disease)    history after decadron reaction  . Infertility, female   . Insomnia   . Menorrhagia 05/02/2018  . Migraine without aura and without status migrainosus, not intractable 03/13/2017  . Migraines   . Mixed hyperlipidemia 03/13/2017  . PCOS (polycystic ovarian syndrome)   . Seasonal allergies   . Sleep apnea    CPAP only when sick    Past Surgical History:  Procedure Laterality Date  . CYSTOSCOPY N/A 07/25/2018   Procedure: CYSTOSCOPY possible cysto;  Surgeon: Megan Salon, MD;  Location: [facility];  Service: Gynecology;  Laterality: N/A;  possible  cysto  . TOTAL LAPAROSCOPIC HYSTERECTOMY WITH SALPINGECTOMY Bilateral 07/25/2018   Procedure: TOTAL LAPAROSCOPIC HYSTERECTOMY WITH SALPINGECTOMY possible BSO;  Surgeon: Megan Salon, MD;  Location: [facility];  Service: Gynecology;  Laterality: Bilateral;  possible BSO  . URETHRAL SLING  09/14/2012   SPARC  . WISDOM TOOTH EXTRACTION      Current Outpatient Medications  Medication Sig Dispense Refill  . cetirizine (ZYRTEC) 10 MG tablet Take 10 mg by mouth daily as needed for allergies.     . cholecalciferol (VITAMIN D) 1000 units tablet Take 1,000 Units by mouth daily.     . Cyanocobalamin (VITAMIN B 12 PO) Take by mouth daily.    . Diclofenac Potassium (ZIPSOR) 25 MG CAPS Take 25 mg by mouth daily as needed (for back and neck pain).     . DULoxetine (CYMBALTA) 60 MG capsule Take 1 capsule (60 mg total) by mouth daily.    Marland Kitchen eletriptan (RELPAX) 40 MG tablet Take 40 mg by mouth every 2 (two) hours as needed for migraine or headache.     . estradiol (ESTRACE) 1 MG tablet TAKE 1 TABLET BY MOUTH EVERY DAY 90 tablet 0  . Galcanezumab-gnlm (EMGALITY) 120 MG/ML SOSY Inject 120 mg into the skin every 30 (thirty) days.     . Magnesium 100 MG CAPS Take  100 mg by mouth daily.     . Multiple Vitamins-Minerals (SUPER THERA VITE M) TABS Take 1 tablet by mouth daily.     . OnabotulinumtoxinA (BOTOX IJ) Inject 1 each into the muscle every 3 (three) months.     . Probiotic Product (PROBIOTIC-10) CAPS Take 1 capsule by mouth daily.     . traZODone (DESYREL) 50 MG tablet Take 50 mg by mouth at bedtime.      No current facility-administered medications for this visit.     Family History  Problem Relation Age of Onset  . Prostate cancer Father   . Skin cancer Father   . Cardiomyopathy Sister   . Ovarian cysts Sister     Review of Systems  All other systems reviewed and are negative.   Exam:   BP 126/88   Pulse 96   Temp (!) 97.4 F (36.3 C) (Temporal)   Ht 5' 2.75" (1.594 m)    Wt 177 lb (80.3 kg)   LMP 07/03/2018 (Exact Date) Comment: Hysterectomy 07/25/2018  BMI 31.60 kg/m    Height: 5' 2.75" (159.4 cm)  Ht Readings from Last 3 Encounters:  07/28/19 5' 2.75" (1.594 m)  09/23/18 5' 4.75" (1.645 m)  09/02/18 5' 4.75" (1.645 m)    General appearance: alert, cooperative and appears stated age Head: Normocephalic, without obvious abnormality, atraumatic Neck: no adenopathy, supple, symmetrical, trachea midline and thyroid normal to inspection and palpation Lungs: clear to auscultation bilaterally Breasts: normal appearance, no masses or tenderness Heart: regular rate and rhythm Abdomen: soft, non-tender; bowel sounds normal; no masses,  no organomegaly Extremities: extremities normal, atraumatic, no cyanosis or edema Skin: Skin color, texture, turgor normal. No rashes or lesions Lymph nodes: Cervical, supraclavicular, and axillary nodes normal. No abnormal inguinal nodes palpated Neurologic: Grossly normal   Pelvic: External genitalia:  no lesions              Urethra:  normal appearing urethra with no masses, tenderness or lesions              Bartholins and Skenes: normal                 Vagina: normal appearing vagina with normal color and discharge, no lesions              Cervix: absent              Pap taken: No. Bimanual Exam:  Uterus:  uterus absent              Adnexa: no mass, fullness, tenderness               Rectovaginal: Confirms               Anus:  normal sphincter tone, no lesions  Chaperone was present for exam.  A:  Well Woman with normal exam H/o TLH/BSO, cystoscopy 07/25/18 Family hx of cardiomyopathy, seeing cardiologist every 3 years Migraines  P:   Mammogram guidelines reviewed.  Pt is going to do this later this year. pap smear not indicated this year GC/Chl/trich obtained.  HIV, RPR, Hep C and Hep B antibody obtained today RF for estradiol 1.0mg  , she takes 1/2 tab BID.  #90/4RF D/w pt tdap indications for administration   IFOB given to pt today Return annually or prn

## 2019-07-29 LAB — HEP, RPR, HIV PANEL
HIV Screen 4th Generation wRfx: NONREACTIVE
Hepatitis B Surface Ag: NEGATIVE
RPR Ser Ql: NONREACTIVE

## 2019-07-29 LAB — HEPATITIS C ANTIBODY: Hep C Virus Ab: <0.1 s/co ratio (ref 0.0–0.9)

## 2019-08-02 LAB — CHLAMYDIA/GONOCOCCUS/TRICHOMONAS, NAA
Chlamydia by NAA: NEGATIVE
Gonococcus by NAA: NEGATIVE
Trich vag by NAA: NEGATIVE

## 2019-08-04 DIAGNOSIS — F4322 Adjustment disorder with anxiety: Secondary | ICD-10-CM | POA: Diagnosis not present

## 2019-08-07 DIAGNOSIS — M722 Plantar fascial fibromatosis: Secondary | ICD-10-CM | POA: Diagnosis not present

## 2019-08-07 DIAGNOSIS — M71571 Other bursitis, not elsewhere classified, right ankle and foot: Secondary | ICD-10-CM | POA: Diagnosis not present

## 2019-08-08 DIAGNOSIS — F4322 Adjustment disorder with anxiety: Secondary | ICD-10-CM | POA: Diagnosis not present

## 2019-08-09 DIAGNOSIS — M7731 Calcaneal spur, right foot: Secondary | ICD-10-CM | POA: Diagnosis not present

## 2019-08-09 DIAGNOSIS — Z01812 Encounter for preprocedural laboratory examination: Secondary | ICD-10-CM | POA: Diagnosis not present

## 2019-08-09 DIAGNOSIS — Z79899 Other long term (current) drug therapy: Secondary | ICD-10-CM | POA: Diagnosis not present

## 2019-08-09 DIAGNOSIS — M722 Plantar fascial fibromatosis: Secondary | ICD-10-CM | POA: Diagnosis not present

## 2019-08-16 DIAGNOSIS — F4322 Adjustment disorder with anxiety: Secondary | ICD-10-CM | POA: Diagnosis not present

## 2019-08-21 DIAGNOSIS — F4322 Adjustment disorder with anxiety: Secondary | ICD-10-CM | POA: Diagnosis not present

## 2019-08-22 DIAGNOSIS — M898X7 Other specified disorders of bone, ankle and foot: Secondary | ICD-10-CM | POA: Diagnosis not present

## 2019-08-22 DIAGNOSIS — M7731 Calcaneal spur, right foot: Secondary | ICD-10-CM | POA: Diagnosis not present

## 2019-08-22 DIAGNOSIS — M722 Plantar fascial fibromatosis: Secondary | ICD-10-CM | POA: Diagnosis not present

## 2019-08-25 DIAGNOSIS — M7731 Calcaneal spur, right foot: Secondary | ICD-10-CM | POA: Diagnosis not present

## 2019-08-30 DIAGNOSIS — F4322 Adjustment disorder with anxiety: Secondary | ICD-10-CM | POA: Diagnosis not present

## 2019-09-05 DIAGNOSIS — F4322 Adjustment disorder with anxiety: Secondary | ICD-10-CM | POA: Diagnosis not present

## 2019-09-06 DIAGNOSIS — M7989 Other specified soft tissue disorders: Secondary | ICD-10-CM | POA: Diagnosis not present

## 2019-09-12 DIAGNOSIS — F4322 Adjustment disorder with anxiety: Secondary | ICD-10-CM | POA: Diagnosis not present

## 2019-09-19 DIAGNOSIS — F4322 Adjustment disorder with anxiety: Secondary | ICD-10-CM | POA: Diagnosis not present

## 2019-09-20 DIAGNOSIS — M7731 Calcaneal spur, right foot: Secondary | ICD-10-CM | POA: Diagnosis not present

## 2019-09-22 DIAGNOSIS — N390 Urinary tract infection, site not specified: Secondary | ICD-10-CM | POA: Diagnosis not present

## 2019-09-22 DIAGNOSIS — R310 Gross hematuria: Secondary | ICD-10-CM | POA: Diagnosis not present

## 2019-09-22 DIAGNOSIS — B9620 Unspecified Escherichia coli [E. coli] as the cause of diseases classified elsewhere: Secondary | ICD-10-CM | POA: Diagnosis not present

## 2019-09-26 DIAGNOSIS — F4322 Adjustment disorder with anxiety: Secondary | ICD-10-CM | POA: Diagnosis not present

## 2019-10-03 DIAGNOSIS — F4322 Adjustment disorder with anxiety: Secondary | ICD-10-CM | POA: Diagnosis not present

## 2019-10-10 DIAGNOSIS — F4322 Adjustment disorder with anxiety: Secondary | ICD-10-CM | POA: Diagnosis not present

## 2019-10-17 DIAGNOSIS — F4322 Adjustment disorder with anxiety: Secondary | ICD-10-CM | POA: Diagnosis not present

## 2019-10-24 DIAGNOSIS — F4322 Adjustment disorder with anxiety: Secondary | ICD-10-CM | POA: Diagnosis not present

## 2019-10-26 ENCOUNTER — Encounter: Payer: Self-pay | Admitting: Obstetrics & Gynecology

## 2019-10-26 DIAGNOSIS — Z1231 Encounter for screening mammogram for malignant neoplasm of breast: Secondary | ICD-10-CM | POA: Diagnosis not present

## 2019-10-31 DIAGNOSIS — F4322 Adjustment disorder with anxiety: Secondary | ICD-10-CM | POA: Diagnosis not present

## 2019-11-07 DIAGNOSIS — F4322 Adjustment disorder with anxiety: Secondary | ICD-10-CM | POA: Diagnosis not present

## 2019-11-14 DIAGNOSIS — F4322 Adjustment disorder with anxiety: Secondary | ICD-10-CM | POA: Diagnosis not present

## 2019-11-21 DIAGNOSIS — F4322 Adjustment disorder with anxiety: Secondary | ICD-10-CM | POA: Diagnosis not present

## 2019-11-24 DIAGNOSIS — Z1211 Encounter for screening for malignant neoplasm of colon: Secondary | ICD-10-CM | POA: Diagnosis not present

## 2019-11-28 DIAGNOSIS — F4322 Adjustment disorder with anxiety: Secondary | ICD-10-CM | POA: Diagnosis not present

## 2019-11-30 LAB — FECAL OCCULT BLOOD, IMMUNOCHEMICAL: Fecal Occult Bld: NEGATIVE

## 2019-12-05 DIAGNOSIS — F4322 Adjustment disorder with anxiety: Secondary | ICD-10-CM | POA: Diagnosis not present

## 2019-12-12 DIAGNOSIS — F4322 Adjustment disorder with anxiety: Secondary | ICD-10-CM | POA: Diagnosis not present

## 2019-12-19 DIAGNOSIS — F4322 Adjustment disorder with anxiety: Secondary | ICD-10-CM | POA: Diagnosis not present

## 2020-09-10 ENCOUNTER — Emergency Department (HOSPITAL_COMMUNITY)
Admission: EM | Admit: 2020-09-10 | Discharge: 2020-09-11 | Disposition: A | Discharge status: Home / Self Care | Payer: 59 | Attending: Emergency Medicine | Admitting: Emergency Medicine

## 2020-09-10 ENCOUNTER — Other Ambulatory Visit: Payer: Self-pay

## 2020-09-10 ENCOUNTER — Encounter (HOSPITAL_COMMUNITY): Payer: Self-pay | Admitting: *Deleted

## 2020-09-10 ENCOUNTER — Emergency Department (HOSPITAL_COMMUNITY): Payer: 59

## 2020-09-10 DIAGNOSIS — G4459 Other complicated headache syndrome: Secondary | ICD-10-CM | POA: Diagnosis not present

## 2020-09-10 DIAGNOSIS — R202 Paresthesia of skin: Secondary | ICD-10-CM | POA: Diagnosis present

## 2020-09-10 DIAGNOSIS — G43109 Migraine with aura, not intractable, without status migrainosus: Secondary | ICD-10-CM

## 2020-09-10 LAB — DIFFERENTIAL
Abs Immature Granulocytes: 0.02 10*3/uL (ref 0.00–0.07)
Basophils Absolute: 0.1 10*3/uL (ref 0.0–0.1)
Basophils Relative: 1 %
Eosinophils Absolute: 0.1 10*3/uL (ref 0.0–0.5)
Eosinophils Relative: 1 %
Immature Granulocytes: 0 %
Lymphocytes Relative: 34 %
Lymphs Abs: 2.6 10*3/uL (ref 0.7–4.0)
Monocytes Absolute: 0.5 10*3/uL (ref 0.1–1.0)
Monocytes Relative: 7 %
Neutro Abs: 4.3 10*3/uL (ref 1.7–7.7)
Neutrophils Relative %: 57 %

## 2020-09-10 LAB — CBC
HCT: 40.6 % (ref 36.0–46.0)
Hemoglobin: 12.9 g/dL (ref 12.0–15.0)
MCH: 31.9 pg (ref 26.0–34.0)
MCHC: 31.8 g/dL (ref 30.0–36.0)
MCV: 100.5 fL — ABNORMAL HIGH (ref 80.0–100.0)
Platelets: 380 10*3/uL (ref 150–400)
RBC: 4.04 MIL/uL (ref 3.87–5.11)
RDW: 12.3 % (ref 11.5–15.5)
WBC: 7.6 10*3/uL (ref 4.0–10.5)
nRBC: 0 % (ref 0.0–0.2)

## 2020-09-10 LAB — COMPREHENSIVE METABOLIC PANEL
ALT: 21 U/L (ref 0–44)
AST: 21 U/L (ref 15–41)
Albumin: 4 g/dL (ref 3.5–5.0)
Alkaline Phosphatase: 76 U/L (ref 38–126)
Anion gap: 10 (ref 5–15)
BUN: 19 mg/dL (ref 6–20)
CO2: 29 mmol/L (ref 22–32)
Calcium: 9.8 mg/dL (ref 8.9–10.3)
Chloride: 100 mmol/L (ref 98–111)
Creatinine, Ser: 0.8 mg/dL (ref 0.44–1.00)
GFR calc Af Amer: >60 mL/min (ref >60)
GFR calc non Af Amer: >60 mL/min (ref >60)
Glucose, Bld: 90 mg/dL (ref 70–99)
Potassium: 4.3 mmol/L (ref 3.5–5.1)
Sodium: 139 mmol/L (ref 135–145)
Total Bilirubin: 0.6 mg/dL (ref 0.3–1.2)
Total Protein: 6.8 g/dL (ref 6.5–8.1)

## 2020-09-10 LAB — PROTIME-INR
INR: 1 (ref 0.8–1.2)
Prothrombin Time: 12.4 seconds (ref 11.4–15.2)

## 2020-09-10 LAB — APTT: aPTT: 29 seconds (ref 24–36)

## 2020-09-10 MED ORDER — SODIUM CHLORIDE 0.9% FLUSH
3.0000 mL | Freq: Once | INTRAVENOUS | Status: AC
  Administered 2020-09-11: 3 mL via INTRAVENOUS

## 2020-09-10 NOTE — ED Triage Notes (Signed)
Pt reports having botox injections last week for migraines. Having dizziness x 2 days with a headache. Reports waking up this am "feeling off" and then noticed numbness to right side of her face. No obv facial droop noted at triage. Speech clear, no extremity weakness.

## 2020-09-11 ENCOUNTER — Encounter (HOSPITAL_COMMUNITY): Payer: Self-pay

## 2020-09-11 ENCOUNTER — Emergency Department (HOSPITAL_COMMUNITY): Payer: 59

## 2020-09-11 MED ORDER — METOCLOPRAMIDE HCL 5 MG/ML IJ SOLN
10.0000 mg | Freq: Once | INTRAMUSCULAR | Status: AC
  Administered 2020-09-11: 10 mg via INTRAVENOUS
  Filled 2020-09-11: qty 2

## 2020-09-11 MED ORDER — SODIUM CHLORIDE 0.9 % IV BOLUS: 1000.0000 mL | Freq: Once | INTRAVENOUS | Status: DC

## 2020-09-11 MED ORDER — DIPHENHYDRAMINE HCL 50 MG/ML IJ SOLN
25.0000 mg | Freq: Once | INTRAMUSCULAR | Status: AC
  Administered 2020-09-11: 25 mg via INTRAVENOUS
  Filled 2020-09-11: qty 1

## 2020-09-11 NOTE — Discharge Instructions (Addendum)
You were seen today for headache and some neurologic symptoms.  Your MRI does not show any evidence of acute stroke.  You likely had a complicated migraine.  Follow-up closely with your neurologist.

## 2020-09-11 NOTE — ED Provider Notes (Signed)
[facility] Provider Note   CSN: [id number] Arrival date & time: 09/10/20  1834     History Chief Complaint  Patient presents with  . Numbness    Anna Thomas is a 49 y.o. female.  HPI     This 49 year old female with a history of migraines, PCOS who presents with headache and right-sided facial numbness and droop.  Patient reports that she gets routine Botox injections for her migraines.  She last got Botox injections last week.  Today she began to have a headache mostly occipital but developed pain and shooting pains behind her right eye.  She subsequently developed tingling in the right side of her face and noted a right-sided facial droop.  She rates her current headache at 8 out of 10.  She did take one of her migraine pills with minimal relief.  She attempted to call her neurologist but did not receive a call back.  She denies any speech difficulty, weakness, other strokelike symptoms.  She does endorse photophobia.  No recent fevers or sick contacts.  Patient also endorses some dizziness.  Denies room spinning dizziness but states she has felt off balance and lightheaded.  Past Medical History:  Diagnosis Date  . Anemia   . Depression   . Elevated blood-pressure reading without diagnosis of hypertension 03/13/2017  . Exercise-induced asthma    Childhood  . Female stress incontinence 05/02/2018  . Fibroids, submucosal   . GERD (gastroesophageal reflux disease)    history after decadron reaction  . Infertility, female   . Insomnia   . Menorrhagia 05/02/2018  . Migraine without aura and without status migrainosus, not intractable 03/13/2017  . Migraines   . Mixed hyperlipidemia 03/13/2017  . PCOS (polycystic ovarian syndrome)   . Seasonal allergies   . Sleep apnea    CPAP only when sick    Patient Active Problem List   Diagnosis Date Noted  . Female stress incontinence 05/02/2018  . Elevated blood-pressure reading without  diagnosis of hypertension 03/13/2017  . Migraine without aura and without status migrainosus, not intractable 03/13/2017  . Mixed hyperlipidemia 03/13/2017    Past Surgical History:  Procedure Laterality Date  . CYSTOSCOPY N/A 07/25/2018   Procedure: CYSTOSCOPY possible cysto;  Surgeon: Megan Salon, MD;  Location: [facility];  Service: Gynecology;  Laterality: N/A;  possible cysto  . TOTAL LAPAROSCOPIC HYSTERECTOMY WITH SALPINGECTOMY Bilateral 07/25/2018   Procedure: TOTAL LAPAROSCOPIC HYSTERECTOMY WITH SALPINGECTOMY possible BSO;  Surgeon: Megan Salon, MD;  Location: [facility];  Service: Gynecology;  Laterality: Bilateral;  possible BSO  . URETHRAL SLING  09/14/2012   SPARC  . WISDOM TOOTH EXTRACTION       OB History    Gravida  2   Para  2   Term  2   Preterm      AB      Living  4     SAB      TAB      Ectopic      Multiple  2   Live Births  4           Family History  Problem Relation Age of Onset  . Prostate cancer Father   . Skin cancer Father   . Cardiomyopathy Sister   . Ovarian cysts Sister     Social History   Tobacco Use  . Smoking status: Never Smoker  . Smokeless tobacco: Never Used  Vaping Use  .  Vaping Use: Never used  Substance Use Topics  . Alcohol use: Yes    Alcohol/week: 2.0 standard drinks    Types: 2 Glasses of wine per week  . Drug use: Never    Home Medications Prior to Admission medications   Medication Sig Start Date End Date Taking? Authorizing Provider  cetirizine (ZYRTEC) 10 MG tablet Take 10 mg by mouth daily as needed for allergies.     [provider]  cholecalciferol (VITAMIN D) 1000 units tablet Take 1,000 Units by mouth daily.  03/17/17   [provider]  Cyanocobalamin (VITAMIN B 12 PO) Take by mouth daily.    [provider]  Diclofenac Potassium (ZIPSOR) 25 MG CAPS Take 25 mg by mouth daily as needed (for back and neck pain).     [provider]  DULoxetine (CYMBALTA) 60 MG capsule Take 1 capsule (60 mg total) by mouth daily. 07/26/18   Megan Salon, MD  eletriptan (RELPAX) 40 MG tablet Take 40 mg by mouth every 2 (two) hours as needed for migraine or headache.     [provider]  estradiol (ESTRACE) 1 MG tablet Take 1 tablet (1 mg total) by mouth daily. 07/28/19   Megan Salon, MD  Galcanezumab-gnlm [facility]) 120 MG/ML SOSY Inject 120 mg into the skin every 30 (thirty) days.     [provider]  Magnesium 100 MG CAPS Take 100 mg by mouth daily.     [provider]  Multiple Vitamins-Minerals (SUPER THERA VITE M) TABS Take 1 tablet by mouth daily.     [provider]  OnabotulinumtoxinA (BOTOX IJ) Inject 1 each into the muscle every 3 (three) months.     [provider]  Probiotic Product (PROBIOTIC-10) CAPS Take 1 capsule by mouth daily.     [provider]  traZODone (DESYREL) 50 MG tablet Take 50 mg by mouth at bedtime.  11/18/15   [provider]    Allergies    Dexamethasone sodium phosphate and Pseudoephedrine hcl  Review of Systems   Review of Systems  Constitutional: Negative for fever.  Eyes: Positive for photophobia. Negative for visual disturbance.  Respiratory: Negative for shortness of breath.   Cardiovascular: Negative for chest pain.  Gastrointestinal: Positive for nausea. Negative for abdominal pain and vomiting.  Genitourinary: Negative for dysuria.  Neurological: Positive for facial asymmetry and headaches.  All other systems reviewed and are negative.   Physical Exam Updated Vital Signs BP 123/88 (BP Location: Left Arm)   Pulse 74   Temp 97.8 F (36.6 C) (Oral)   Resp 18   LMP 07/03/2018 (Exact Date) Comment: Hysterectomy 07/25/2018  SpO2 98%   Physical Exam Vitals and nursing note reviewed.  Constitutional:      Appearance: She is well-developed. She is not ill-appearing.  HENT:     Head: Normocephalic and atraumatic.      Mouth/Throat:     Mouth: Mucous membranes are moist.  Eyes:     Pupils: Pupils are equal, round, and reactive to light.  Cardiovascular:     Rate and Rhythm: Normal rate and regular rhythm.     Heart sounds: Normal heart sounds.  Pulmonary:     Effort: Pulmonary effort is normal. No respiratory distress.     Breath sounds: No wheezing.  Abdominal:     Palpations: Abdomen is soft.     Tenderness: There is no abdominal tenderness.  Musculoskeletal:     Cervical back: Neck supple.  Right lower leg: No edema.     Left lower leg: No edema.  Skin:    General: Skin is warm and dry.  Neurological:     Mental Status: She is alert and oriented to person, place, and time.     Comments: Cranial nerves II through XII intact, 5 out of 5 strength in all 4 extremities, no dysmetria to finger-nose-finger, no obvious facial droop, subjective decreased sensation to light touch right side of face.  Psychiatric:        Mood and Affect: Mood normal.     ED Results / Procedures / Treatments   Labs (all labs ordered are listed, but only abnormal results are displayed) Labs Reviewed  CBC - Abnormal; Notable for the following components:      Result Value   MCV 100.5 (*)    All other components within normal limits  PROTIME-INR  APTT  DIFFERENTIAL  COMPREHENSIVE METABOLIC PANEL    EKG EKG Interpretation  Date/Time:  Tuesday September 10 2020 19:28:13 EDT Ventricular Rate:  82 PR Interval:  152 QRS Duration: 90 QT Interval:  368 QTC Calculation: 429 R Axis:   92 Text Interpretation: Normal sinus rhythm Rightward axis Borderline ECG No old tracing to compare Confirmed by Delora Fuel ([id number]) on 09/11/2020 1:11:47 AM   Radiology CT HEAD WO CONTRAST  Result Date: 09/10/2020 CLINICAL DATA:  Dizziness and headaches with right-sided facial numbness. EXAM: CT HEAD WITHOUT CONTRAST TECHNIQUE: Contiguous axial images were obtained from the base of the skull through the vertex without  intravenous contrast. COMPARISON:  None. FINDINGS: Brain: No evidence of acute infarction, hemorrhage, hydrocephalus, extra-axial collection or mass lesion/mass effect. Vascular: No hyperdense vessel or unexpected calcification. Skull: Normal. Negative for fracture or focal lesion. Sinuses/Orbits: No acute finding. Other: None. IMPRESSION: No acute intracranial pathology. Electronically Signed   By: Virgina Norfolk M.D.   On: 09/10/2020 21:43   MR BRAIN WO CONTRAST  Result Date: 09/11/2020 CLINICAL DATA:  Initial evaluation for acute headache, facial droop/tingling. EXAM: MRI HEAD WITHOUT CONTRAST TECHNIQUE: Multiplanar, multiecho pulse sequences of the brain and surrounding structures were obtained without intravenous contrast. COMPARISON:  Prior head CT from 09/10/2020. FINDINGS: Brain: Cerebral volume within normal limits for patient age. No focal parenchymal signal abnormality identified. No abnormal foci of restricted diffusion to suggest acute or subacute ischemia. Gray-white matter differentiation well maintained. No encephalomalacia to suggest chronic infarction. No foci of susceptibility artifact to suggest acute or chronic intracranial hemorrhage. No mass lesion, midline shift or mass effect. No hydrocephalus. No extra-axial fluid collection. Major dural sinuses are grossly patent. Pituitary gland and suprasellar region are normal. Midline structures intact and normal. Vascular: Major intracranial vascular flow voids well maintained and normal in appearance. Skull and upper cervical spine: Craniocervical junction normal. Visualized upper cervical spine within normal limits. Bone marrow signal intensity normal. No scalp soft tissue abnormality. Sinuses/Orbits: Globes and orbital soft tissues within normal limits. Paranasal sinuses are clear. No mastoid effusion. Inner ear structures normal. Other: None. IMPRESSION: Normal brain MRI. No acute intracranial abnormality identified. Electronically Signed    By: Jeannine Boga M.D.   On: 09/11/2020 03:30    Procedures Procedures (including critical care time)  Medications Ordered in ED Medications  sodium chloride flush (NS) 0.9 % injection 3 mL (3 mLs Intravenous Given 09/11/20 0233)  sodium chloride 0.9 % bolus 1,000 mL (1,000 mLs Intravenous Bolus from Bag 09/11/20 0233)  metoCLOPramide (REGLAN) injection 10 mg (10 mg Intravenous Given 09/11/20 0233)  diphenhydrAMINE (  BENADRYL) injection 25 mg (25 mg Intravenous Given 09/11/20 0233)    ED Course  I have reviewed the triage vital signs and the nursing notes.  Pertinent labs & imaging results that were available during my care of the patient were reviewed by me and considered in my medical decision making (see chart for details).    MDM Rules/Calculators/A&P                           Patient presents with headache and neurologic symptoms.  She is overall nontoxic and vital signs are reassuring.  History of recurrent migraines.  However, facial symptoms are new.  Suspect complicated migraine; however, stroke or subarachnoid hemorrhage would also be a consideration.  Patient was treated with migraine cocktail.  Lab work reviewed from triage and is reassuring without metabolic derangement.  CT head reviewed and shows no evidence of bleed.  EKG without ischemia or arrhythmia changes.  Will obtain MRI to rule out stroke.  MRI reviewed and shows no evidence of acute stroke.  Highly suspicious for complicated migraine.  Will have her follow-up with her outpatient neurologist.  After history, exam, and medical workup I feel the patient has been appropriately medically screened and is safe for discharge home. Pertinent diagnoses were discussed with the patient. Patient was given return precautions.   Final Clinical Impression(s) / ED Diagnoses Final diagnoses:  Complicated migraine    Rx / DC Orders ED Discharge Orders    None       Melainie Krinsky, Barbette Hair, MD 09/11/20 [phone]

## 2020-10-07 ENCOUNTER — Encounter: Payer: Self-pay | Admitting: Gastroenterology

## 2020-10-08 NOTE — Progress Notes (Signed)
49 y.o. G22P2004 Separated White or Caucasian female here for annual exam.  Kids are in high school at [employer].  Divorce will be finalized in January.  Soon to be ex will be staying in [place].  She's in a condo and she loves it.  Dating someone--has been together 8 months.    Has a complicated migraine in September.  Had some facial droop with this headache.  Had a CT and then MRI which were negative.  Continues to follow with Dr. Marrianne Mood in [place].    Denies vaginal bleeding.    Patient's last menstrual period was 07/03/2018 (exact date).          Sexually active: Yes.    The current method of family planning is status post hysterectomy.    Exercising: Yes.    walking & working with trainer Smoker:  no  Health Maintenance: Pap:  05-02-18 neg HPV HR neg History of abnormal Pap:  no MMG:  10-26-2019 category b density birads 1:neg Colonoscopy:  None, scheduled for 11/2020 BMD:   none TDaP:  Unsure, d/w pt when to update Pneumonia vaccine(s):  no Shingrix:   no Hep C testing: neg 2020 Screening Labs: tsh and lipids today (non fasting)   reports that she has never smoked. She has never used smokeless tobacco. She reports current alcohol use of about 2.0 standard drinks of alcohol per week. She reports that she does not use drugs.  Past Medical History:  Diagnosis Date  . Anemia   . Depression   . Elevated blood-pressure reading without diagnosis of hypertension 03/13/2017  . Exercise-induced asthma    Childhood  . Female stress incontinence 05/02/2018  . Fibroids, submucosal   . GERD (gastroesophageal reflux disease)    history after decadron reaction  . Infertility, female   . Insomnia   . Menorrhagia 05/02/2018  . Migraine without aura and without status migrainosus, not intractable 03/13/2017  . Migraines   . Mixed hyperlipidemia 03/13/2017  . PCOS (polycystic ovarian syndrome)   . Seasonal allergies   . Sleep apnea    CPAP only when sick    Past Surgical History:   Procedure Laterality Date  . CYSTOSCOPY N/A 07/25/2018   Procedure: CYSTOSCOPY possible cysto;  Surgeon: Megan Salon, MD;  Location: [facility];  Service: Gynecology;  Laterality: N/A;  possible cysto  . FOOT SURGERY Right 2020  . TOTAL LAPAROSCOPIC HYSTERECTOMY WITH SALPINGECTOMY Bilateral 07/25/2018   Procedure: TOTAL LAPAROSCOPIC HYSTERECTOMY WITH SALPINGECTOMY possible BSO;  Surgeon: Megan Salon, MD;  Location[facility];  Service: Gynecology;  Laterality: Bilateral;  possible BSO  . URETHRAL SLING  09/14/2012   SPARC  . WISDOM TOOTH EXTRACTION      Current Outpatient Medications  Medication Sig Dispense Refill  . B Complex Vitamins (B COMPLEX PO) Take by mouth.    . cetirizine (ZYRTEC) 10 MG tablet Take 10 mg by mouth daily as needed for allergies.     . cholecalciferol (VITAMIN D) 1000 units tablet Take 1,000 Units by mouth daily.     . Diclofenac Potassium (ZIPSOR) 25 MG CAPS Take 25 mg by mouth daily as needed (for back and neck pain).     . DULoxetine (CYMBALTA) 60 MG capsule Take 1 capsule (60 mg total) by mouth daily.    Marland Kitchen eletriptan (RELPAX) 40 MG tablet Take 40 mg by mouth every 2 (two) hours as needed for migraine or headache.     . estradiol (ESTRACE) 1 MG  tablet Take 1 tablet (1 mg total) by mouth daily. 90 tablet 4  . Galcanezumab-gnlm (EMGALITY) 120 MG/ML SOSY Inject 120 mg into the skin every 30 (thirty) days.     . Multiple Vitamins-Minerals (SUPER THERA VITE M) TABS Take 1 tablet by mouth daily.     . OnabotulinumtoxinA (BOTOX IJ) Inject 1 each into the muscle every 3 (three) months.     . Probiotic Product (PROBIOTIC-10) CAPS Take 1 capsule by mouth daily.     . Rimegepant Sulfate (NURTEC) 75 MG TBDP Take by mouth.    . traZODone (DESYREL) 50 MG tablet Take 50 mg by mouth at bedtime.      No current facility-administered medications for this visit.    Family History  Problem Relation Age of Onset  . Prostate cancer Father   .  Skin cancer Father   . Cardiomyopathy Sister   . Ovarian cysts Sister     Review of Systems  Constitutional: Negative.   HENT: Negative.   Eyes: Negative.   Respiratory: Negative.   Cardiovascular: Negative.   Gastrointestinal: Negative.   Endocrine: Negative.   Genitourinary: Negative.   Musculoskeletal: Negative.   Skin: Negative.   Allergic/Immunologic: Negative.   Neurological: Negative.   Hematological: Negative.   Psychiatric/Behavioral: Negative.     Exam:   BP 110/70   Pulse 70   Resp 16   Ht 5' 2.75" (1.594 m)   Wt 173 lb (78.5 kg)   LMP 07/03/2018 (Exact Date) Comment: Hysterectomy 07/25/2018  BMI 30.89 kg/m   Height: 5' 2.75" (159.4 cm)  General appearance: alert, cooperative and appears stated age Head: Normocephalic, without obvious abnormality, atraumatic Neck: no adenopathy, supple, symmetrical, trachea midline and thyroid normal to inspection and palpation Lungs: clear to auscultation bilaterally Breasts: normal appearance, no masses or tenderness Heart: regular rate and rhythm Abdomen: soft, non-tender; bowel sounds normal; no masses,  no organomegaly Extremities: extremities normal, atraumatic, no cyanosis or edema Skin: Skin color, texture, turgor normal. No rashes or lesions Lymph nodes: Cervical, supraclavicular, and axillary nodes normal. No abnormal inguinal nodes palpated Neurologic: Grossly normal   Pelvic: External genitalia:  no lesions              Urethra:  normal appearing urethra with no masses, tenderness or lesions              Bartholins and Skenes: normal                 Vagina: normal appearing vagina with normal color and discharge, no lesions              Cervix: absent              Pap taken: No. Bimanual Exam:  Uterus:  uterus absent              Adnexa: no mass, fullness, tenderness               Rectovaginal: Confirms               Anus:  normal sphincter tone, no lesions  Chaperone, Terence Lux, CMA, was present  for exam.  A:  Well Woman with normal exam H/o TLH/BSO, cystoscopy 07/25/2018 Migraines followed by Dr. Winfred Burn in [place] (per pt still ok with being on HRT) Strong family hx of CVD, seeing Dr. Harrington Challenger every 3 years  P:   Mammogram guidelines reviewed.  Has this scheduled for 01/2021. pap smear not indicated Colonoscopy is scheduled for the  end of December TSH and lipids obtained today Vaccines discussed today RF for estradiol 1.0mg  daily.  #90/4RF.  Risks of stroke reviewed.   Return annually or prn

## 2020-10-10 ENCOUNTER — Ambulatory Visit (INDEPENDENT_AMBULATORY_CARE_PROVIDER_SITE_OTHER): Payer: 59 | Admitting: Obstetrics & Gynecology

## 2020-10-10 ENCOUNTER — Other Ambulatory Visit: Payer: Self-pay

## 2020-10-10 ENCOUNTER — Encounter: Payer: Self-pay | Admitting: Obstetrics & Gynecology

## 2020-10-10 VITALS — BP 110/70 | HR 70 | Resp 16 | Ht 62.75 in | Wt 173.0 lb

## 2020-10-10 DIAGNOSIS — Z01419 Encounter for gynecological examination (general) (routine) without abnormal findings: Secondary | ICD-10-CM | POA: Diagnosis not present

## 2020-10-10 DIAGNOSIS — Z0000 Encounter for general adult medical examination without abnormal findings: Secondary | ICD-10-CM

## 2020-10-10 MED ORDER — ESTRADIOL 1 MG PO TABS
1.0000 mg | ORAL_TABLET | Freq: Every day | ORAL | 4 refills | Status: DC
Start: 2020-10-10 — End: 2021-10-13

## 2020-10-14 ENCOUNTER — Telehealth: Payer: Self-pay

## 2020-10-14 NOTE — Telephone Encounter (Signed)
Spoke with patient. Advised labs from 10/10/20 couldn't be processed with [facility]. Patient is scheduled for recollection on 10/16/2020 at 9:10 am. Patient verbalizes understanding and is agreeable to date and time.  Routing to provider and will close encounter.

## 2020-10-15 LAB — LIPID PANEL

## 2020-10-15 LAB — TSH

## 2020-10-16 ENCOUNTER — Other Ambulatory Visit: Payer: Self-pay

## 2020-10-16 ENCOUNTER — Other Ambulatory Visit: Payer: 59

## 2020-10-16 DIAGNOSIS — Z0000 Encounter for general adult medical examination without abnormal findings: Secondary | ICD-10-CM

## 2020-10-16 NOTE — Progress Notes (Signed)
Orders placed for redraw per [facility] from 10/10/20.

## 2020-10-17 ENCOUNTER — Other Ambulatory Visit: Payer: Self-pay

## 2020-10-17 ENCOUNTER — Other Ambulatory Visit (INDEPENDENT_AMBULATORY_CARE_PROVIDER_SITE_OTHER): Payer: 59

## 2020-10-17 DIAGNOSIS — Z0000 Encounter for general adult medical examination without abnormal findings: Secondary | ICD-10-CM

## 2020-10-18 LAB — LIPID PANEL
Chol/HDL Ratio: 3.3 ratio (ref 0.0–4.4)
Cholesterol, Total: 215 mg/dL — ABNORMAL HIGH (ref 100–199)
HDL: 65 mg/dL (ref >39)
LDL Chol Calc (NIH): 121 mg/dL — ABNORMAL HIGH (ref 0–99)
Triglycerides: 168 mg/dL — ABNORMAL HIGH (ref 0–149)
VLDL Cholesterol Cal: 29 mg/dL (ref 5–40)

## 2020-10-18 LAB — TSH: TSH: 1.65 u[IU]/mL (ref 0.450–4.500)

## 2020-10-24 ENCOUNTER — Ambulatory Visit: Payer: 59 | Admitting: Physician Assistant

## 2020-11-11 ENCOUNTER — Inpatient Hospital Stay
Admission: EM | Admit: 2020-11-11 | Discharge: 2020-11-13 | DRG: 419 | Disposition: A | Discharge status: Home / Self Care | Payer: No Typology Code available for payment source | Attending: Surgery | Admitting: Surgery

## 2020-11-11 ENCOUNTER — Emergency Department: Payer: No Typology Code available for payment source

## 2020-11-11 DIAGNOSIS — K8050 Calculus of bile duct without cholangitis or cholecystitis without obstruction: Secondary | ICD-10-CM | POA: Insufficient documentation

## 2020-11-11 DIAGNOSIS — K8021 Calculus of gallbladder without cholecystitis with obstruction: Secondary | ICD-10-CM

## 2020-11-11 DIAGNOSIS — Z90710 Acquired absence of both cervix and uterus: Secondary | ICD-10-CM

## 2020-11-11 DIAGNOSIS — K8061 Calculus of gallbladder and bile duct with cholecystitis, unspecified, with obstruction: Principal | ICD-10-CM | POA: Diagnosis present

## 2020-11-11 DIAGNOSIS — R1084 Generalized abdominal pain: Secondary | ICD-10-CM

## 2020-11-11 DIAGNOSIS — Z20822 Contact with and (suspected) exposure to covid-19: Secondary | ICD-10-CM | POA: Diagnosis present

## 2020-11-11 DIAGNOSIS — K8020 Calculus of gallbladder without cholecystitis without obstruction: Secondary | ICD-10-CM | POA: Insufficient documentation

## 2020-11-11 HISTORY — DX: Migraine, unspecified, not intractable, without status migrainosus: G43.909

## 2020-11-11 LAB — URINALYSIS REFLEX TO MICROSCOPIC EXAM - REFLEX TO CULTURE
Bilirubin, UA: NEGATIVE
Blood, UA: NEGATIVE
Glucose, UA: NEGATIVE
Ketones UA: NEGATIVE
Leukocyte Esterase, UA: NEGATIVE
Nitrite, UA: NEGATIVE
Protein, UR: NEGATIVE
Specific Gravity UA: 1.016 (ref 1.001–1.035)
Urine pH: 7 (ref 5.0–8.0)
Urobilinogen, UA: 4 mg/dL — AB (ref 0.2–2.0)

## 2020-11-11 LAB — COMPREHENSIVE METABOLIC PANEL
ALT: 242 U/L — ABNORMAL HIGH (ref 0–55)
AST (SGOT): 326 U/L — ABNORMAL HIGH (ref 5–34)
Albumin/Globulin Ratio: 1.4 (ref 0.9–2.2)
Albumin: 4.2 g/dL (ref 3.5–5.0)
Alkaline Phosphatase: 109 U/L — ABNORMAL HIGH (ref 37–106)
Anion Gap: 9 (ref 5.0–15.0)
BUN: 18 mg/dL (ref 7–19)
Bilirubin, Total: 1.5 mg/dL — ABNORMAL HIGH (ref 0.2–1.2)
CO2: 28 mEq/L (ref 22–29)
Calcium: 10.2 mg/dL (ref 8.5–10.5)
Chloride: 101 mEq/L (ref 100–111)
Creatinine: 0.8 mg/dL (ref 0.6–1.0)
Globulin: 3.1 g/dL (ref 2.0–3.6)
Glucose: 142 mg/dL — ABNORMAL HIGH (ref 70–100)
Potassium: 4 mEq/L (ref 3.5–5.1)
Protein, Total: 7.3 g/dL (ref 6.0–8.3)
Sodium: 138 mEq/L (ref 136–145)

## 2020-11-11 LAB — LIPASE: Lipase: 28 U/L (ref 8–78)

## 2020-11-11 LAB — CBC AND DIFFERENTIAL
Absolute NRBC: 0 10*3/uL (ref 0.00–0.00)
Basophils Absolute Automated: 0.04 10*3/uL (ref 0.00–0.08)
Basophils Automated: 0.4 %
Eosinophils Absolute Automated: 0.02 10*3/uL (ref 0.00–0.44)
Eosinophils Automated: 0.2 %
Hematocrit: 40.2 % (ref 34.7–43.7)
Hgb: 13.3 g/dL (ref 11.4–14.8)
Immature Granulocytes Absolute: 0.03 10*3/uL (ref 0.00–0.07)
Immature Granulocytes: 0.3 %
Lymphocytes Absolute Automated: 1 10*3/uL (ref 0.42–3.22)
Lymphocytes Automated: 9.8 %
MCH: 32.4 pg (ref 25.1–33.5)
MCHC: 33.1 g/dL (ref 31.5–35.8)
MCV: 97.8 fL — ABNORMAL HIGH (ref 78.0–96.0)
MPV: 8.9 fL (ref 8.9–12.5)
Monocytes Absolute Automated: 0.58 10*3/uL (ref 0.21–0.85)
Monocytes: 5.7 %
Neutrophils Absolute: 8.53 10*3/uL — ABNORMAL HIGH (ref 1.10–6.33)
Neutrophils: 83.6 %
Nucleated RBC: 0 /100 WBC (ref 0.0–0.0)
Platelets: 356 10*3/uL — ABNORMAL HIGH (ref 142–346)
RBC: 4.11 10*6/uL (ref 3.90–5.10)
RDW: 12 % (ref 11–15)
WBC: 10.2 10*3/uL — ABNORMAL HIGH (ref 3.10–9.50)

## 2020-11-11 LAB — GFR: EGFR: >60

## 2020-11-11 MED ORDER — MORPHINE SULFATE 4 MG/ML IJ/IV SOLN (WRAP)
4.0000 mg | Freq: Once | Status: AC
Start: 2020-11-11 — End: 2020-11-11
  Administered 2020-11-11: 23:00:00 4 mg via INTRAVENOUS
  Filled 2020-11-11: qty 1

## 2020-11-11 MED ORDER — IOHEXOL 350 MG/ML IV SOLN
100.0000 mL | Freq: Once | INTRAVENOUS | Status: AC | PRN
Start: 2020-11-11 — End: 2020-11-11
  Administered 2020-11-11: 20:00:00 100 mL via INTRAVENOUS

## 2020-11-11 MED ORDER — MORPHINE SULFATE 4 MG/ML IJ/IV SOLN (WRAP)
4.0000 mg | Freq: Once | Status: AC
Start: 2020-11-11 — End: 2020-11-11
  Administered 2020-11-11: 19:00:00 4 mg via INTRAVENOUS
  Filled 2020-11-11: qty 1

## 2020-11-11 MED ORDER — STERILE WATER FOR INJECTION IJ SOLN
4.5000 g | Freq: Three times a day (TID) | INTRAVENOUS | Status: DC
Start: 2020-11-11 — End: 2020-11-12
  Administered 2020-11-11: 4.5 g via INTRAVENOUS
  Filled 2020-11-11: qty 20

## 2020-11-11 MED ORDER — SODIUM CHLORIDE 0.9 % IV MBP
1.0000 g | Freq: Once | INTRAVENOUS | Status: AC
Start: 2020-11-11 — End: 2020-11-11
  Administered 2020-11-11: 21:00:00 1 g via INTRAVENOUS
  Filled 2020-11-11: qty 1000

## 2020-11-11 MED ORDER — MORPHINE SULFATE 2 MG/ML IJ/IV SOLN (WRAP)
2.0000 mg | Status: DC | PRN
Start: 2020-11-11 — End: 2020-11-13
  Administered 2020-11-12 (×3): 2 mg via INTRAVENOUS
  Filled 2020-11-11 (×3): qty 1

## 2020-11-11 MED ORDER — SODIUM CHLORIDE 0.9 % IV BOLUS
1000.0000 mL | Freq: Once | INTRAVENOUS | Status: AC
Start: 2020-11-11 — End: 2020-11-12
  Administered 2020-11-11: 19:00:00 1000 mL via INTRAVENOUS

## 2020-11-11 MED ORDER — ONDANSETRON HCL 4 MG/2ML IJ SOLN
4.0000 mg | Freq: Once | INTRAMUSCULAR | Status: AC
Start: 2020-11-11 — End: 2020-11-11
  Administered 2020-11-11: 23:00:00 4 mg via INTRAVENOUS
  Filled 2020-11-11: qty 2

## 2020-11-11 MED ORDER — ONDANSETRON HCL 4 MG/2ML IJ SOLN
4.0000 mg | Freq: Four times a day (QID) | INTRAMUSCULAR | Status: DC | PRN
Start: 2020-11-11 — End: 2020-11-13
  Administered 2020-11-12 – 2020-11-13 (×3): 4 mg via INTRAVENOUS
  Filled 2020-11-11 (×3): qty 2

## 2020-11-11 MED ORDER — ESTRADIOL 1 MG PO TABS
1.0000 mg | ORAL_TABLET | Freq: Every day | ORAL | Status: DC
Start: 2020-11-12 — End: 2020-11-12
  Filled 2020-11-11: qty 1

## 2020-11-11 MED ORDER — ONDANSETRON HCL 4 MG/2ML IJ SOLN
4.0000 mg | Freq: Once | INTRAMUSCULAR | Status: AC
Start: 2020-11-11 — End: 2020-11-11
  Administered 2020-11-11: 19:00:00 4 mg via INTRAVENOUS
  Filled 2020-11-11: qty 2

## 2020-11-11 MED ORDER — SODIUM CHLORIDE 0.9 % IV SOLN
INTRAVENOUS | Status: DC
Start: 2020-11-11 — End: 2020-11-13

## 2020-11-11 NOTE — ED Triage Notes (Signed)
Pt c/o diffuse abdominal pain and bloating starting today. 4 BMs today that weren't diarrhea that temporarily helped her feel better. +nausea

## 2020-11-11 NOTE — Consults (Deleted)
CONSULTATION REPORT  [facility] [phone]      Consultation Date Time: 11/11/20 10:58 PM  Date of Admission: 11/11/2020     Requesting Physician: Elray Mcgregor, MD  Consulting Physician: Dr. Zollie Beckers   Consulting Service: General Surgery    Reason For Consultation:  Abdominal Pain      Impression:   There is no problem list on file for this patient.      Plan:   - admit to medicine   - Consult GI for possible ERCP in am, if labs worsen including t bili most likely choledocholithiasis  - Check labs (CMP) in am   - Recommend zosyn IV   - keep NPO   - discussed with pt about recommendation for lap cholecystectomy, most likely during this hospital admission depending on the above plans  - will follow with pt in am   - plan discussed with pt and Dr. Arvilla Market.       History of Present Illness:   Stacy Cummings is a 49 y.o. female who  has a past medical history of Migraine..  She presented to the hospital with complaints of abdominal pain.  The pain is described as bloating and general diffuse abdominal pain with radiation to back. Onset was today after breakfast. Reports eating special K cereal, toast and yougurt.  The night before for dinner had enchiladas and rice.  Symptoms have been gradually worsening since. Aggravating factors: none.  Alleviating factors: pain medication. Associated symptoms: nausea. The patient denies fever, vomiting.  Had 4 formed bm's today.      Pt is currently not working.  Lives in [place].  Visiting boyfriends family here in [place].      Past Medical History:     Past Medical History:   Diagnosis Date    Migraine        Past Surgical History:     Past Surgical History:   Procedure Laterality Date    BLADDER REPAIR      HYSTERECTOMY         Family History:   History reviewed. No pertinent family history.    Social History:     Social History     Socioeconomic History    Marital status: Married     Spouse name: Not on file    Number of children: Not on file    Years of education: Not  on file    Highest education level: Not on file   Occupational History    Not on file   Tobacco Use    Smoking status: Not on file    Smokeless tobacco: Not on file   Substance and Sexual Activity    Alcohol use: Not on file    Drug use: Not on file    Sexual activity: Not on file   Other Topics Concern    Not on file   Social History Narrative    Not on file     Social Determinants of Health     Financial Resource Strain:     Difficulty of Paying Living Expenses: Not on file   Food Insecurity:     Worried About Running Out of Food in the Last Year: Not on file    Ran Out of Food in the Last Year: Not on file   Transportation Needs:     Lack of Transportation (Medical): Not on file    Lack of Transportation (Non-Medical): Not on file   Physical Activity:     Days of  Exercise per Week: Not on file    Minutes of Exercise per Session: Not on file   Stress:     Feeling of Stress : Not on file   Social Connections:     Frequency of Communication with Friends and Family: Not on file    Frequency of Social Gatherings with Friends and Family: Not on file    Attends Religious Services: Not on file    Active Member of Clubs or Organizations: Not on file    Attends Banker Meetings: Not on file    Marital Status: Not on file   Intimate Partner Violence:     Fear of Current or Ex-Partner: Not on file    Emotionally Abused: Not on file    Physically Abused: Not on file    Sexually Abused: Not on file   Housing Stability:     Unable to Pay for Housing in the Last Year: Not on file    Number of Places Lived in the Last Year: Not on file    Unstable Housing in the Last Year: Not on file       Allergies:     Allergies   Allergen Reactions    Decadron [Dexamethasone] Other (See Comments)     Esophageal acid reflux    Pseudoephedrine Palpitations       Medications:     Prior to Admission medications    Not on File       Review of Systems:   As per the HPI.  The patient denies any additional  changes to their otic, opthalmologic, dermatologic, pulmonary, cardiac, gastrointestinal, genitourinary, musculoskeletal, hematologic, constitutional, or psychiatric systems.      Physical Exam:     Vitals:    11/11/20 2209   BP: 140/87   Pulse: (!) 58   Resp:    Temp: 97.7 F (36.5 C)   SpO2: 99%       General appearance - alert, well appearing, and in no distress  Mental status - alert, oriented to person, place, and time  Eyes - pupils equal and reactive, extraocular eye movements intact  Neck - supple, no significant adenopathy  Chest - clear to auscultation, no wheezes, rales or rhonchi, symmetric air entry  Heart - normal rate, regular rhythm, normal S1, S2, no murmurs, rubs, clicks or gallops  Abdomen - obese, soft, mild general tenderness, nondistended, no masses or organomegaly  Neurological - alert, oriented, normal speech, no focal findings or movement disorder noted  Musculoskeletal - no joint tenderness, deformity or swelling  Extremities - peripheral pulses normal, no pedal edema, no clubbing or cyanosis  Skin - normal coloration and turgor, no rashes, no suspicious skin lesions noted    Labs:     Results     Procedure Component Value Units Date/Time    Comprehensive metabolic panel [[id number]]  (Abnormal) Collected: 11/11/20 1836    Specimen: Blood Updated: 11/11/20 1901     Glucose 142 mg/dL      BUN 18 mg/dL      Creatinine 0.8 mg/dL      Sodium 213 mEq/L      Potassium 4.0 mEq/L      Chloride 101 mEq/L      CO2 28 mEq/L      Calcium 10.2 mg/dL      Protein, Total 7.3 g/dL      Albumin 4.2 g/dL      AST (SGOT) 086 U/L      ALT 242 U/L  Alkaline Phosphatase 109 U/L      Bilirubin, Total 1.5 mg/dL      Globulin 3.1 g/dL      Albumin/Globulin Ratio 1.4     Anion Gap 9.0    Lipase [[id number]] Collected: 11/11/20 1836    Specimen: Blood Updated: 11/11/20 1901     Lipase 28 U/L     GFR [[id number]] Collected: 11/11/20 1836     Updated: 11/11/20 1901     EGFR >60.0    Urinalysis Reflex to Microscopic  Exam- Reflex to Culture [[id number]]  (Abnormal) Collected: 11/11/20 1836     Updated: 11/11/20 1847     Urine Type Urine, Clean Ca     Color, UA Amber     Clarity, UA Cloudy     Specific Gravity UA 1.016     Urine pH 7.0     Leukocyte Esterase, UA Negative     Nitrite, UA Negative     Protein, UR Negative     Glucose, UA Negative     Ketones UA Negative     Urobilinogen, UA 4.0 mg/dL      Bilirubin, UA Negative     Blood, UA Negative    CBC and differential [[id number]]  (Abnormal) Collected: 11/11/20 1836    Specimen: Blood Updated: 11/11/20 1844     WBC 10.20 x10 3/uL      Hgb 13.3 g/dL      Hematocrit 03.4 %      Platelets 356 x10 3/uL      RBC 4.11 x10 6/uL      MCV 97.8 fL      MCH 32.4 pg      MCHC 33.1 g/dL      RDW 12 %      MPV 8.9 fL      Neutrophils 83.6 %      Lymphocytes Automated 9.8 %      Monocytes 5.7 %      Eosinophils Automated 0.2 %      Basophils Automated 0.4 %      Immature Granulocytes 0.3 %      Nucleated RBC 0.0 /100 WBC      Neutrophils Absolute 8.53 x10 3/uL      Lymphocytes Absolute Automated 1.00 x10 3/uL      Monocytes Absolute Automated 0.58 x10 3/uL      Eosinophils Absolute Automated 0.02 x10 3/uL      Basophils Absolute Automated 0.04 x10 3/uL      Immature Granulocytes Absolute 0.03 x10 3/uL      Absolute NRBC 0.00 x10 3/uL           Rads:     Radiology Results (24 Hour)     Procedure Component Value Units Date/Time    US Abdomen Limited RUQ [[id number]] Collected: 11/11/20 2152    Order Status: Completed Updated: 11/11/20 2158    Narrative:      HISTORY: Abdominal pain, CT findings suggesting workup for acute  cholecystitis    COMPARISON: CT abdomen pelvis performed at 7:41 PM    TECHNIQUE: Right upper quadrant ultrasound.    FINDINGS:    LIVER: Normal echotexture. No sonographic evidence of masses or  intrahepatic biliary dilatation.    GALLBLADDER: There are multiple echogenic layering stones which are  mobile the gallbladder. There is no evidence of wall thickening. There  is  trace pericholecystic fluid/edema. Sonographic Murphy's sign could  not be evaluated as the patient was medicated.    COMMON BILE DUCT: 6 mm  in maximum dimension.    PANCREAS: No abnormality of the visualized portion.    RIGHT KIDNEY: 10.8 cm in length.  No hydronephrosis, cysts or shadowing  stones.    AORTA/IVC: Aorta is normal in caliber. Proximal inferior vena cava is  seen.    PERITONEUM: No free fluid in the right upper quadrant.      Impression:           Cholelithiasis with pericholecystic edema, without gallbladder wall  thickening. The common bile duct is dilated up to 6 mm. Recommend  further evaluation with MRI to evaluate for choledocholithiasis.    Theador Hawthorne, MD   11/11/2020 9:56 PM    CT Abd/Pelvis with IV Contrast only [[id number]] Collected: 11/11/20 1937    Order Status: Completed Updated: 11/11/20 1945    Narrative:      HISTORY: Acute nonlocalized abdominal pain with history of hysterectomy.    COMPARISON: None.    TECHNIQUE: CT abdomen and pelvis WITH contrast. 100 mL IV Omnipaque 350  was administered. Oral contrast was not administered. The following dose  reduction techniques were utilized: Automated exposure control and/or  adjustment of the mA and/or kV according to patient size, and the use of  iterative reconstruction technique.    FINDINGS:     LINES/TUBES: None.    LOWER THORAX:  There are dependent changes in the lung bases.    LIVER/BILIARY TREE:  No mass or intrahepatic biliary dilatation. No  gallstones are seen. There is suggestion of pericholecystic edema with  mild distention.  SPLEEN: No splenomegaly.  PANCREAS:  No pancreatic mass or duct dilatation.    KIDNEY/URETERS: No hydronephrosis, stones or solid mass lesions.  ADRENALS:  No adrenal mass.  PELVIC ORGANS/BLADDER: No pelvic masses.    PERITONEUM/RETROPERITONEUM: No free air or fluid.  LYMPH NODES: No lymphadenopathy.  VESSELS: No aortic aneurysm. There is made of mild periportal edema.    GI TRACT:  No bowel dilation. There  is made of intestinal malrotation.    BONES AND SOFT TISSUES:  There is no acute osseous abnormality. There is  moderate degenerative disease at L5-S1..      Impression:          1.  Suggestion of pericholecystic edema with mild gallbladder  distention. Recommend further evaluation with right upper quadrant  ultrasound to exclude acute cholecystitis.  2.  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    Theador Hawthorne, MD   11/11/2020 7:43 PM    Abdomen Portable [[id number]] Collected: 11/11/20 1920    Order Status: Completed Updated: 11/11/20 1923    Narrative:      HISTORY: Pain    COMPARISON: None.    TECHNIQUE: Supine AP views of the abdomen and pelvis    FINDINGS:     There is a nonspecific nonobstructive bowel gas pattern. Relative  paucity of bowel gas is noted in the right hemiabdomen. There is no  supine evidence of free intraperitoneal air. There is no visualized  acute osseous abnormality.      Impression:           Nonspecific nonobstructive bowel gas pattern.    Theador Hawthorne, MD   11/11/2020 7:21 PM          Signed by: Tenna Delaine, FNP

## 2020-11-11 NOTE — H&P (Signed)
HISTORY AND PHYSICAL  [facility] [phone]      Admission Date: 11/11/20  Patient Name: Stacy Cummings  Attending Physician: Rick Duff,*  Admitting Service:  General Surgery    Reason for Admission: Abdominal Pain    History of Present Illness:   Stacy Cummings is a 49 y.o. female who  has a past medical history of Migraine..  She presented to the hospital with complaints of abdominal pain.  The pain is described as bloating and general diffuse abdominal pain with radiation to back. Onset was today after breakfast. Reports eating special K cereal, toast and yougurt.  The night before for dinner had enchiladas and rice.  Symptoms have been gradually worsening since. Aggravating factors: none.  Alleviating factors: pain medication. Associated symptoms: nausea. The patient denies fever, vomiting.  Had 4 formed bm's today.      Pt is currently not working.  Lives in [place].  Visiting boyfriends family here in [place].      Past Medical History:     Past Medical History:   Diagnosis Date    Migraine        Past Surgical History:     Past Surgical History:   Procedure Laterality Date    BLADDER REPAIR      HYSTERECTOMY         Family History:   History reviewed. No pertinent family history.    Social History:     Social History     Socioeconomic History    Marital status: Married     Spouse name: Not on file    Number of children: Not on file    Years of education: Not on file    Highest education level: Not on file   Occupational History    Not on file   Tobacco Use    Smoking status: Not on file    Smokeless tobacco: Not on file   Substance and Sexual Activity    Alcohol use: Not on file    Drug use: Not on file    Sexual activity: Not on file   Other Topics Concern    Not on file   Social History Narrative    Not on file     Social Determinants of Health     Financial Resource Strain:     Difficulty of Paying Living Expenses: Not on file   Food Insecurity:     Worried About  Running Out of Food in the Last Year: Not on file    Ran Out of Food in the Last Year: Not on file   Transportation Needs:     Lack of Transportation (Medical): Not on file    Lack of Transportation (Non-Medical): Not on file   Physical Activity:     Days of Exercise per Week: Not on file    Minutes of Exercise per Session: Not on file   Stress:     Feeling of Stress : Not on file   Social Connections:     Frequency of Communication with Friends and Family: Not on file    Frequency of Social Gatherings with Friends and Family: Not on file    Attends Religious Services: Not on file    Active Member of Clubs or Organizations: Not on file    Attends Banker Meetings: Not on file    Marital Status: Not on file   Intimate Partner Violence:     Fear of Current or Ex-Partner: Not on file  Emotionally Abused: Not on file    Physically Abused: Not on file    Sexually Abused: Not on file   Housing Stability:     Unable to Pay for Housing in the Last Year: Not on file    Number of Places Lived in the Last Year: Not on file    Unstable Housing in the Last Year: Not on file       Allergies:     Allergies   Allergen Reactions    Decadron [Dexamethasone] Other (See Comments)     Esophageal acid reflux    Pseudoephedrine Palpitations       Medications:     Prior to Admission medications    Not on File       Review of Systems:   As per the HPI.  The patient denies any additional changes to their otic, opthalmologic, dermatologic, pulmonary, cardiac, gastrointestinal, genitourinary, musculoskeletal, hematologic, constitutional, or psychiatric systems.    Physical Exam:     Vitals:    11/11/20 2209   BP: 140/87   Pulse: (!) 58   Resp:    Temp: 97.7 F (36.5 C)   SpO2: 99%       General appearance - alert, well appearing, and in no distress  Mental status - alert, oriented to person, place, and time  Eyes - pupils equal and reactive, extraocular eye movements intact  Neck - supple, no significant  adenopathy  Chest - clear to auscultation, no wheezes, rales or rhonchi, symmetric air entry  Heart - normal rate, regular rhythm, normal S1, S2, no murmurs, rubs, clicks or gallops  Abdomen - obese, soft, mild general tenderness, nondistended, no masses or organomegaly  Neurological - alert, oriented, normal speech, no focal findings or movement disorder noted  Musculoskeletal - no joint tenderness, deformity or swelling  Extremities - peripheral pulses normal, no pedal edema, no clubbing or cyanosis  Skin - normal coloration and turgor, no rashes, no suspicious skin lesions noted    Labs:     Results     Procedure Component Value Units Date/Time    Comprehensive metabolic panel [[id number]]  (Abnormal) Collected: 11/11/20 1836    Specimen: Blood Updated: 11/11/20 1901     Glucose 142 mg/dL      BUN 18 mg/dL      Creatinine 0.8 mg/dL      Sodium 409 mEq/L      Potassium 4.0 mEq/L      Chloride 101 mEq/L      CO2 28 mEq/L      Calcium 10.2 mg/dL      Protein, Total 7.3 g/dL      Albumin 4.2 g/dL      AST (SGOT) 811 U/L      ALT 242 U/L      Alkaline Phosphatase 109 U/L      Bilirubin, Total 1.5 mg/dL      Globulin 3.1 g/dL      Albumin/Globulin Ratio 1.4     Anion Gap 9.0    Lipase [[id number]] Collected: 11/11/20 1836    Specimen: Blood Updated: 11/11/20 1901     Lipase 28 U/L     GFR [[id number]] Collected: 11/11/20 1836     Updated: 11/11/20 1901     EGFR >60.0    Urinalysis Reflex to Microscopic Exam- Reflex to Culture [[id number]]  (Abnormal) Collected: 11/11/20 1836     Updated: 11/11/20 1847     Urine Type Urine, Clean Ca     Color, UA Amber  Clarity, UA Cloudy     Specific Gravity UA 1.016     Urine pH 7.0     Leukocyte Esterase, UA Negative     Nitrite, UA Negative     Protein, UR Negative     Glucose, UA Negative     Ketones UA Negative     Urobilinogen, UA 4.0 mg/dL      Bilirubin, UA Negative     Blood, UA Negative    CBC and differential [[id number]]  (Abnormal) Collected: 11/11/20 1836    Specimen: Blood  Updated: 11/11/20 1844     WBC 10.20 x10 3/uL      Hgb 13.3 g/dL      Hematocrit 40.9 %      Platelets 356 x10 3/uL      RBC 4.11 x10 6/uL      MCV 97.8 fL      MCH 32.4 pg      MCHC 33.1 g/dL      RDW 12 %      MPV 8.9 fL      Neutrophils 83.6 %      Lymphocytes Automated 9.8 %      Monocytes 5.7 %      Eosinophils Automated 0.2 %      Basophils Automated 0.4 %      Immature Granulocytes 0.3 %      Nucleated RBC 0.0 /100 WBC      Neutrophils Absolute 8.53 x10 3/uL      Lymphocytes Absolute Automated 1.00 x10 3/uL      Monocytes Absolute Automated 0.58 x10 3/uL      Eosinophils Absolute Automated 0.02 x10 3/uL      Basophils Absolute Automated 0.04 x10 3/uL      Immature Granulocytes Absolute 0.03 x10 3/uL      Absolute NRBC 0.00 x10 3/uL           Rads:     Radiology Results (24 Hour)     Procedure Component Value Units Date/Time    US Abdomen Limited RUQ [[id number]] Collected: 11/11/20 2152    Order Status: Completed Updated: 11/11/20 2158    Narrative:      HISTORY: Abdominal pain, CT findings suggesting workup for acute  cholecystitis    COMPARISON: CT abdomen pelvis performed at 7:41 PM    TECHNIQUE: Right upper quadrant ultrasound.    FINDINGS:    LIVER: Normal echotexture. No sonographic evidence of masses or  intrahepatic biliary dilatation.    GALLBLADDER: There are multiple echogenic layering stones which are  mobile the gallbladder. There is no evidence of wall thickening. There  is trace pericholecystic fluid/edema. Sonographic Murphy's sign could  not be evaluated as the patient was medicated.    COMMON BILE DUCT: 6 mm in maximum dimension.    PANCREAS: No abnormality of the visualized portion.    RIGHT KIDNEY: 10.8 cm in length.  No hydronephrosis, cysts or shadowing  stones.    AORTA/IVC: Aorta is normal in caliber. Proximal inferior vena cava is  seen.    PERITONEUM: No free fluid in the right upper quadrant.      Impression:           Cholelithiasis with pericholecystic edema, without gallbladder  wall  thickening. The common bile duct is dilated up to 6 mm. Recommend  further evaluation with MRI to evaluate for choledocholithiasis.    Theador Hawthorne, MD   11/11/2020 9:56 PM    CT Abd/Pelvis with IV Contrast only [[id number]] Collected:  11/11/20 1937    Order Status: Completed Updated: 11/11/20 1945    Narrative:      HISTORY: Acute nonlocalized abdominal pain with history of hysterectomy.    COMPARISON: None.    TECHNIQUE: CT abdomen and pelvis WITH contrast. 100 mL IV Omnipaque 350  was administered. Oral contrast was not administered. The following dose  reduction techniques were utilized: Automated exposure control and/or  adjustment of the mA and/or kV according to patient size, and the use of  iterative reconstruction technique.    FINDINGS:     LINES/TUBES: None.    LOWER THORAX:  There are dependent changes in the lung bases.    LIVER/BILIARY TREE:  No mass or intrahepatic biliary dilatation. No  gallstones are seen. There is suggestion of pericholecystic edema with  mild distention.  SPLEEN: No splenomegaly.  PANCREAS:  No pancreatic mass or duct dilatation.    KIDNEY/URETERS: No hydronephrosis, stones or solid mass lesions.  ADRENALS:  No adrenal mass.  PELVIC ORGANS/BLADDER: No pelvic masses.    PERITONEUM/RETROPERITONEUM: No free air or fluid.  LYMPH NODES: No lymphadenopathy.  VESSELS: No aortic aneurysm. There is made of mild periportal edema.    GI TRACT:  No bowel dilation. There is made of intestinal malrotation.    BONES AND SOFT TISSUES:  There is no acute osseous abnormality. There is  moderate degenerative disease at L5-S1..      Impression:          1.  Suggestion of pericholecystic edema with mild gallbladder  distention. Recommend further evaluation with right upper quadrant  ultrasound to exclude acute cholecystitis.  2.  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    Theador Hawthorne, MD   11/11/2020 7:43 PM    Abdomen Portable [[id number]] Collected: 11/11/20 1920    Order  Status: Completed Updated: 11/11/20 1923    Narrative:      HISTORY: Pain    COMPARISON: None.    TECHNIQUE: Supine AP views of the abdomen and pelvis    FINDINGS:     There is a nonspecific nonobstructive bowel gas pattern. Relative  paucity of bowel gas is noted in the right hemiabdomen. There is no  supine evidence of free intraperitoneal air. There is no visualized  acute osseous abnormality.      Impression:           Nonspecific nonobstructive bowel gas pattern.    Theador Hawthorne, MD   11/11/2020 7:21 PM          Impression:     Patient Active Problem List   Diagnosis    Choledocholithiasis       Plan:   - admit to general surgery   - Consult GI for possible ERCP in am, if labs worsen including t bili most likely choledocholithiasis  - Check labs  in am   - Recommend zosyn IV   - keep NPO   - discussed with pt about recommendation for lap cholecystectomy, most likely during this hospital admission depending on the above plans  - will follow with pt in am   - plan discussed with pt and Dr. Arvilla Market.       Signed by: Tenna Delaine, FNP

## 2020-11-11 NOTE — ED Notes (Signed)
Pt medicated for pain/full medication effect pending. IVF's infusing. Resting in stretcher/updated on careplan.

## 2020-11-12 ENCOUNTER — Inpatient Hospital Stay: Payer: No Typology Code available for payment source

## 2020-11-12 ENCOUNTER — Encounter: Payer: Self-pay | Admitting: Surgery

## 2020-11-12 DIAGNOSIS — K8050 Calculus of bile duct without cholangitis or cholecystitis without obstruction: Secondary | ICD-10-CM

## 2020-11-12 LAB — COMPREHENSIVE METABOLIC PANEL
ALT: 397 U/L — ABNORMAL HIGH (ref 0–55)
AST (SGOT): 432 U/L — ABNORMAL HIGH (ref 5–34)
Albumin/Globulin Ratio: 1.4 (ref 0.9–2.2)
Albumin: 3.4 g/dL — ABNORMAL LOW (ref 3.5–5.0)
Alkaline Phosphatase: 100 U/L (ref 37–106)
Anion Gap: 6 (ref 5.0–15.0)
BUN: 10 mg/dL (ref 7–19)
Bilirubin, Total: 1.6 mg/dL — ABNORMAL HIGH (ref 0.2–1.2)
CO2: 28 mEq/L (ref 22–29)
Calcium: 8.9 mg/dL (ref 8.5–10.5)
Chloride: 107 mEq/L (ref 100–111)
Creatinine: 0.8 mg/dL (ref 0.6–1.0)
Globulin: 2.5 g/dL (ref 2.0–3.6)
Glucose: 91 mg/dL (ref 70–100)
Potassium: 3.9 mEq/L (ref 3.5–5.1)
Protein, Total: 5.9 g/dL — ABNORMAL LOW (ref 6.0–8.3)
Sodium: 141 mEq/L (ref 136–145)

## 2020-11-12 LAB — COVID-19 (SARS-COV-2): SARS CoV 2 Overall Result: NEGATIVE

## 2020-11-12 LAB — GFR: EGFR: >60

## 2020-11-12 LAB — BILIRUBIN, TOTAL AND DIRECT
Bilirubin Direct: 1.1 mg/dL — ABNORMAL HIGH (ref 0.0–0.5)
Bilirubin Indirect: 0.5 mg/dL (ref 0.2–1.0)

## 2020-11-12 MED ORDER — DULOXETINE HCL 60 MG PO CPEP
60.0000 mg | ORAL_CAPSULE | Freq: Every day | ORAL | Status: DC
Start: 2020-11-12 — End: 2020-11-13
  Administered 2020-11-13: 08:00:00 60 mg via ORAL
  Filled 2020-11-12: qty 1

## 2020-11-12 MED ORDER — ESTRADIOL 1 MG PO TABS
0.5000 mg | ORAL_TABLET | Freq: Two times a day (BID) | ORAL | Status: DC
Start: 2020-11-12 — End: 2020-11-13
  Administered 2020-11-12 – 2020-11-13 (×2): 0.5 mg via ORAL
  Filled 2020-11-12 (×5): qty 1

## 2020-11-12 MED ORDER — TRAZODONE HCL 50 MG PO TABS
60.0000 mg | ORAL_TABLET | Freq: Every evening | ORAL | Status: DC
Start: 2020-11-12 — End: 2020-11-13
  Filled 2020-11-12: qty 2

## 2020-11-12 MED ORDER — HYDROMORPHONE HCL 0.5 MG/0.5 ML IJ SOLN
0.5000 mg | INTRAMUSCULAR | Status: DC | PRN
Start: 2020-11-12 — End: 2020-11-13
  Administered 2020-11-12 (×3): 0.5 mg via INTRAVENOUS
  Filled 2020-11-12 (×3): qty 0.5

## 2020-11-12 MED ORDER — DEXTROSE-SODIUM CHLORIDE 5-0.45 % IV SOLN
INTRAVENOUS | Status: DC
Start: 2020-11-12 — End: 2020-11-13

## 2020-11-12 MED ORDER — MORPHINE SULFATE 4 MG/ML IJ/IV SOLN (WRAP)
4.0000 mg | Status: AC | PRN
Start: 2020-11-12 — End: 2020-11-12

## 2020-11-12 MED ORDER — NALOXONE HCL 0.4 MG/ML IJ SOLN (WRAP)
0.2000 mg | INTRAMUSCULAR | Status: DC | PRN
Start: 2020-11-12 — End: 2020-11-13

## 2020-11-12 MED ORDER — KETOROLAC TROMETHAMINE 30 MG/ML IJ SOLN
15.0000 mg | Freq: Four times a day (QID) | INTRAMUSCULAR | Status: DC | PRN
Start: 2020-11-12 — End: 2020-11-12

## 2020-11-12 MED ORDER — SODIUM CHLORIDE 0.9 % IV MBP
4.5000 g | Freq: Three times a day (TID) | INTRAVENOUS | Status: DC
Start: 2020-11-12 — End: 2020-11-13
  Administered 2020-11-12 – 2020-11-13 (×4): 4.5 g via INTRAVENOUS
  Filled 2020-11-12 (×5): qty 20

## 2020-11-12 MED ORDER — KETOROLAC TROMETHAMINE 30 MG/ML IJ SOLN
15.0000 mg | Freq: Four times a day (QID) | INTRAMUSCULAR | Status: DC | PRN
Start: 2020-11-12 — End: 2020-11-13
  Administered 2020-11-12 – 2020-11-13 (×3): 15 mg via INTRAVENOUS
  Filled 2020-11-12 (×3): qty 1

## 2020-11-12 MED ORDER — GADOBUTROL 1 MMOL/ML IV SOLN
8.0000 mL | Freq: Once | INTRAVENOUS | Status: AC | PRN
Start: 2020-11-12 — End: 2020-11-12
  Administered 2020-11-12: 13:00:00 8 mmol via INTRAVENOUS

## 2020-11-12 MED ORDER — SUMATRIPTAN SUCCINATE 50 MG PO TABS
50.0000 mg | ORAL_TABLET | Freq: Two times a day (BID) | ORAL | Status: DC | PRN
Start: 2020-11-12 — End: 2020-11-13
  Filled 2020-11-12: qty 1

## 2020-11-12 MED ORDER — ONDANSETRON HCL 4 MG/2ML IJ SOLN
4.0000 mg | INTRAMUSCULAR | Status: DC | PRN
Start: 2020-11-12 — End: 2020-11-13
  Administered 2020-11-12: 07:00:00 4 mg via INTRAVENOUS
  Filled 2020-11-12: qty 2

## 2020-11-12 NOTE — Plan of Care (Signed)
Problem: Safety  Goal: Patient will be free from injury during hospitalization  Outcome: Progressing     Problem: Safety  Goal: Patient will be free from infection during hospitalization  Outcome: Progressing     Problem: Pain  Goal: Pain at adequate level as identified by patient  Outcome: Progressing     Problem: Side Effects from Pain Analgesia  Goal: Patient will experience minimal side effects of analgesic therapy  Outcome: Progressing     Problem: Discharge Barriers  Goal: Patient will be discharged home or other facility with appropriate resources  Outcome: Progressing     Problem: Psychosocial and Spiritual Needs  Goal: Demonstrates ability to cope with hospitalization/illness  Outcome: Progressing

## 2020-11-12 NOTE — ED Notes (Signed)
[facility] EMERGENCY DEPARTMENT  ED NURSING NOTE FOR THE RECEIVING INPATIENT NURSE   ED NURSE Frederic Jericho [phone]   ED CHARGE RN Candise Bowens   ADMISSION INFORMATION   Stacy Cummings is a 49 y.o. female admitted with an ED diagnosis of:    1. Choledocholithiasis    2. Calculus of gallbladder with biliary obstruction but without cholecystitis    3. Generalized abdominal pain         Isolation: None   Allergies: Decadron [dexamethasone] and Pseudoephedrine   Holding Orders confirmed? Yes   Belongings Documented? Yes   Home medications sent to pharmacy confirmed? N/A   NURSING CARE   Patient Comes From:   Mental Status: Home Independent  alert and oriented   ADL: Independent with all ADLs   Ambulation: no difficulty   Pertinent Information  and Safety Concerns: n/a     CT / NIH   CT Head ordered on this patient?  N/A   NIH/Dysphagia assessment done prior to admission? N/A   VITAL SIGNS (at the time of this note)      Vitals:    11/11/20 2352   BP: 130/82   Pulse: 60   Resp: 17   Temp: 97.5 F (36.4 C)   SpO2: 96%

## 2020-11-12 NOTE — ED Provider Notes (Signed)
[facility] History and Physical Exam     Patient Name: Stacy, Cummings  Encounter Date:  11/11/2020  Attending Physician: Elray Mcgregor, MD  Patient DOB:  01/14/1971  MRN:  [id number]  Room:  H 5/H 5    Chief Complaint     Chief Complaint   Patient presents with    Abdominal Pain     History of Presenting Illness     49 y.o. female with no past history of significant GI problems, she has had a total abdominal hysterectomy and BSO for fibroids in the past but otherwise no past surgical history in the abdomen or pelvis, started getting bloating and diffuse abdominal pain, diffuse but she thinks left is worse than right, starting earlier today.  She has been somewhat nauseous with this.  She has not had any diarrhea.  In fact had 4 bowel movements today, formed.  It has been increasing throughout the day.  She did not take any medications for it.  At 2:30 PM she took a little bit of toast and yogurt but the pain got immediately worse after that.  No p.o. since 2:30 PM.    Moderate to severe dull constant ache, worse with pushing on it.    PMD:  Pcp, None, MD    Nursing Notes Review  Nursing Notes were reviewed.     Previous Records Review  Previous records were reviewed to the extent practicable for the current presentation.          Physical Exam     Vital Signs  BP 130/82    Pulse 60    Temp 97.5 F (36.4 C) (Oral)    Resp 17    Wt 80.2 kg    SpO2 96%     Review of Vital Signs  The patient's vital signs and oxygen saturation were reviewed and interpreted by me, Elray Mcgregor, MD.    Physical Exam    Constitutional: No acute distress. Color is not ashen.   Eyes: No conjunctival discharge. EOMI.   Head, Ears, Nose, Mouth, Throat: Normocephalic. Moist mucous membranes.   Neck: Full range of motion. No obvious neck deformities. No tracheal deviation.   Cardiovascular: Normal peripheral pulses. Normal capillary refill. Regular rhythm.  Respiratory/Chest: No obvious chest wall  asymmetry. No resp distress. CTAB.  Gastrointestinal/Abdominal:  Soft abdomen.  Minimal distention.  She has mild diffuse nonlocalizing abdominal tenderness.  Hypoactive bowel sounds.   no rigidity, no guarding. . No peritoneal signs, benign exam.  Musculoskeletal: Normal ROM. No focal tenderness. No LEE.   Back: No deformity. No significant scoliosis.   Neurological: Alert. No acute focal deficits.   Skin: Warm, color not mottled. No acute rash.         Medical Decision Making     Hx & Exam Synthesis, Differential Diagnosis, Plan     Abdominal pain: We will start with CT, labs, UA, will give pain medicine.  Unclear cause at this point given how general it is.  Could be SBO.  Will order x-ray to rule out SBO/perforation, but if they are not ready for for the CT scan is ready, will forego it.      ED Course, Monitors, EKG, Critical Care, Splints, Consults, Reevaluation, etc       CT came back suggesting gallbladder pathology.  Could be cholecystitis.  I ordered an ultrasound.  Ultrasound shows no gallbladder wall thickening but there is slight pericholecystic fluid, and CBD is 6 mm.  LFTs  are mildly elevated along with bilirubin of 1.5, choledocholithiasis is probably the most likely scenario that fits these data, there may also be a component of cholecystitis.  We will plan to admit with surgery consulting and GI.    ED Course as of 11/12/20 0050   Mon Nov 11, 2020   2028 Pt feeling a lot better. U/s ordered.  [CM]   2320 I spoke c dr Nedra Hai at gi: they will consult.  [CM]   2336 I spoke c Shana, she came down and eval'd pt.     I spoke c Dr Sherlean Foot: he requests surg svc admit with gi consulting.     I spoke c Shana again at [facility]: she accepts, Laurine Blazer is att'g.  [CM]      ED Course User Index  [CM] Elray Mcgregor, MD         Past Medical History     Past Medical History:   Diagnosis Date    Migraine        Medications       Current Facility-Administered Medications:     0.9% NaCl infusion, , Intravenous,  Continuous, Gliem, Shana A, FNP, Last Rate: 100 mL/hr at 11/12/20 0030, New Bag at 11/12/20 0030    estradiol (ESTRACE) tablet 1 mg, 1 mg, Oral, Daily, Elray Mcgregor, MD    morphine injection 2 mg, 2 mg, Intravenous, Q4H PRN, Gliem, Shana A, FNP    ondansetron (ZOFRAN) injection 4 mg, 4 mg, Intravenous, Q6H PRN, Gliem, Shana A, FNP    piperacillin-tazobactam (ZOSYN) 4.5 g in sterile water (preservative free) 20 mL IV push injection, 4.5 g, Intravenous, Q8H, Gliem, Shana A, FNP, 4.5 g at 11/11/20 2342  No current outpatient medications on file.    Allergies     Allergies   Allergen Reactions    Decadron [Dexamethasone] Other (See Comments)     Esophageal acid reflux    Pseudoephedrine Palpitations       Medical history, medications, and allergies reviewed.     Past Surgical History     Past Surgical History:   Procedure Laterality Date    BLADDER REPAIR      HYSTERECTOMY         Family History   The family history is not significantly contributory to current presentation.   History reviewed. No pertinent family history.    Social History   Social history is not significantly contributory to the patient's current presentation.   Social History     Occupational History    Not on file   Tobacco Use    Smoking status: Not on file    Smokeless tobacco: Not on file   Substance and Sexual Activity    Alcohol use: Not on file    Drug use: Not on file    Sexual activity: Not on file       Review of Systems     See HPI for review of systems that is relevant to the current presentation.   All other systems reviewed: negative.     ED Medications Administered     ED Medication Orders (From admission, onward)    Start Ordered     Status Ordering Provider    11/12/20 0900 11/11/20 2228  estradiol (ESTRACE) tablet 1 mg  Daily        Route: Oral  Ordered Dose: 1 mg     Acknowledged Dolan Xia N    11/11/20 2344 11/11/20 2343  0.9% NaCl infusion  Continuous  Route: Intravenous     Last MAR action:  New Bag Osie Cheeks A    11/11/20 2343 11/11/20 2343  ondansetron (ZOFRAN) injection 4 mg  Every 6 hours PRN        Route: Intravenous  Ordered Dose: 4 mg     Acknowledged Osie Cheeks A    11/11/20 2343 11/11/20 2343  morphine injection 2 mg  Every 4 hours PRN        Route: Intravenous  Ordered Dose: 2 mg     Acknowledged Osie Cheeks A    11/11/20 2316 11/11/20 2315  piperacillin-tazobactam (ZOSYN) 4.5 g in sterile water (preservative free) 20 mL IV push injection  Every 8 hours        Route: Intravenous  Ordered Dose: 4.5 g     Last MAR action: Given Osie Cheeks A    11/11/20 2229 11/11/20 2228  morphine injection 4 mg  Once        Route: Intravenous  Ordered Dose: 4 mg     Last MAR action: Given Kham Zuckerman N    11/11/20 2229 11/11/20 2228  ondansetron (ZOFRAN) injection 4 mg  Once        Route: Intravenous  Ordered Dose: 4 mg     Last MAR action: Given Chantale Leugers N    11/11/20 2019 11/11/20 2018  ertapenem (INVanz) 1 g in sodium chloride 0.9 % 100 mL IVPB mini-bag plus  Once        Note to Pharmacy: This order is post-timed automatically by Epic--but please give this now.   Route: Intravenous  Ordered Dose: 1 g     Last MAR action: Stopped Dailee Manalang N    11/11/20 1930 11/11/20 1931  iohexol (OMNIPAQUE) 350 MG/ML injection 100 mL  IMG once as needed        Route: Intravenous  Ordered Dose: 100 mL     Last MAR action: Imaging Agent Given Jhonathan Desroches N    11/11/20 1846 11/11/20 1845  morphine injection 4 mg  Once        Route: Intravenous  Ordered Dose: 4 mg     Last MAR action: Given Romuald Mccaslin N    11/11/20 1831 11/11/20 1830  sodium chloride 0.9 % bolus 1,000 mL  Once        Route: Intravenous  Ordered Dose: 1,000 mL     Last MAR action: Stopped Ilir Mahrt N    11/11/20 1831 11/11/20 1830  ondansetron (ZOFRAN) injection 4 mg  Once        Route: Intravenous  Ordered Dose: 4 mg     Last MAR action: Given Ripley Bogosian N          Orders Placed During This  Encounter     Orders Placed This Encounter   Procedures    COVID-19 (SARS-COV-2) ([facility])    Abdomen Portable    CT Abd/Pelvis with IV Contrast only    US Abdomen Limited RUQ    CBC and differential    Comprehensive metabolic panel    Lipase    Urinalysis Reflex to Microscopic Exam- Reflex to Culture    GFR    Comprehensive metabolic panel    Bilirubin (Fractionated), Total & Direct    GFR    Diet NPO effective now    Full Code    ED Unit Sec Comm Order    Saline lock IV    Adult Admit to Inpatient       Diagnostic  Study Results     The results of the diagnostic studies below were reviewed by the ED provider:    Labs  Results     Procedure Component Value Units Date/Time    COVID-19 (SARS-COV-2) [facility]) [[id number]] Collected: 11/12/20 0036    Specimen: Nasopharyngeal Swab from Nasopharynx Updated: 11/12/20 0045     Purpose of COVID testing Screening     SARS-CoV-2 Specimen Source Nasopharyngeal    Narrative:      o Collect and clearly label specimen type:  o Upper respiratory specimen: One Nasopharyngeal Dry Swab NO  Transport Media.  o Hand deliver to laboratory ASAP  Indication for testing->Extended care facility admission to  semi private room  Screening    Comprehensive metabolic panel [[id number]]  (Abnormal) Collected: 11/11/20 1836    Specimen: Blood Updated: 11/11/20 1901     Glucose 142 mg/dL      BUN 18 mg/dL      Creatinine 0.8 mg/dL      Sodium 782 mEq/L      Potassium 4.0 mEq/L      Chloride 101 mEq/L      CO2 28 mEq/L      Calcium 10.2 mg/dL      Protein, Total 7.3 g/dL      Albumin 4.2 g/dL      AST (SGOT) 956 U/L      ALT 242 U/L      Alkaline Phosphatase 109 U/L      Bilirubin, Total 1.5 mg/dL      Globulin 3.1 g/dL      Albumin/Globulin Ratio 1.4     Anion Gap 9.0    Lipase [[id number]] Collected: 11/11/20 1836    Specimen: Blood Updated: 11/11/20 1901     Lipase 28 U/L     GFR [[id number]] Collected: 11/11/20 1836     Updated: 11/11/20 1901     EGFR >60.0    Urinalysis  Reflex to Microscopic Exam- Reflex to Culture [[id number]]  (Abnormal) Collected: 11/11/20 1836     Updated: 11/11/20 1847     Urine Type Urine, Clean Ca     Color, UA Amber     Clarity, UA Cloudy     Specific Gravity UA 1.016     Urine pH 7.0     Leukocyte Esterase, UA Negative     Nitrite, UA Negative     Protein, UR Negative     Glucose, UA Negative     Ketones UA Negative     Urobilinogen, UA 4.0 mg/dL      Bilirubin, UA Negative     Blood, UA Negative    CBC and differential [[id number]]  (Abnormal) Collected: 11/11/20 1836    Specimen: Blood Updated: 11/11/20 1844     WBC 10.20 x10 3/uL      Hgb 13.3 g/dL      Hematocrit 03.4 %      Platelets 356 x10 3/uL      RBC 4.11 x10 6/uL      MCV 97.8 fL      MCH 32.4 pg      MCHC 33.1 g/dL      RDW 12 %      MPV 8.9 fL      Neutrophils 83.6 %      Lymphocytes Automated 9.8 %      Monocytes 5.7 %      Eosinophils Automated 0.2 %      Basophils Automated 0.4 %      Immature Granulocytes  0.3 %      Nucleated RBC 0.0 /100 WBC      Neutrophils Absolute 8.53 x10 3/uL      Lymphocytes Absolute Automated 1.00 x10 3/uL      Monocytes Absolute Automated 0.58 x10 3/uL      Eosinophils Absolute Automated 0.02 x10 3/uL      Basophils Absolute Automated 0.04 x10 3/uL      Immature Granulocytes Absolute 0.03 x10 3/uL      Absolute NRBC 0.00 x10 3/uL           Radiologic Studies  Radiology Results (24 Hour)     Procedure Component Value Units Date/Time    US Abdomen Limited RUQ [[id number]] Collected: 11/11/20 2152    Order Status: Completed Updated: 11/11/20 2158    Narrative:      HISTORY: Abdominal pain, CT findings suggesting workup for acute  cholecystitis    COMPARISON: CT abdomen pelvis performed at 7:41 PM    TECHNIQUE: Right upper quadrant ultrasound.    FINDINGS:    LIVER: Normal echotexture. No sonographic evidence of masses or  intrahepatic biliary dilatation.    GALLBLADDER: There are multiple echogenic layering stones which are  mobile the gallbladder. There is no evidence  of wall thickening. There  is trace pericholecystic fluid/edema. Sonographic Murphy's sign could  not be evaluated as the patient was medicated.    COMMON BILE DUCT: 6 mm in maximum dimension.    PANCREAS: No abnormality of the visualized portion.    RIGHT KIDNEY: 10.8 cm in length.  No hydronephrosis, cysts or shadowing  stones.    AORTA/IVC: Aorta is normal in caliber. Proximal inferior vena cava is  seen.    PERITONEUM: No free fluid in the right upper quadrant.      Impression:           Cholelithiasis with pericholecystic edema, without gallbladder wall  thickening. The common bile duct is dilated up to 6 mm. Recommend  further evaluation with MRI to evaluate for choledocholithiasis.    Theador Hawthorne, MD   11/11/2020 9:56 PM    CT Abd/Pelvis with IV Contrast only [[id number]] Collected: 11/11/20 1937    Order Status: Completed Updated: 11/11/20 1945    Narrative:      HISTORY: Acute nonlocalized abdominal pain with history of hysterectomy.    COMPARISON: None.    TECHNIQUE: CT abdomen and pelvis WITH contrast. 100 mL IV Omnipaque 350  was administered. Oral contrast was not administered. The following dose  reduction techniques were utilized: Automated exposure control and/or  adjustment of the mA and/or kV according to patient size, and the use of  iterative reconstruction technique.    FINDINGS:     LINES/TUBES: None.    LOWER THORAX:  There are dependent changes in the lung bases.    LIVER/BILIARY TREE:  No mass or intrahepatic biliary dilatation. No  gallstones are seen. There is suggestion of pericholecystic edema with  mild distention.  SPLEEN: No splenomegaly.  PANCREAS:  No pancreatic mass or duct dilatation.    KIDNEY/URETERS: No hydronephrosis, stones or solid mass lesions.  ADRENALS:  No adrenal mass.  PELVIC ORGANS/BLADDER: No pelvic masses.    PERITONEUM/RETROPERITONEUM: No free air or fluid.  LYMPH NODES: No lymphadenopathy.  VESSELS: No aortic aneurysm. There is made of mild periportal edema.    GI  TRACT:  No bowel dilation. There is made of intestinal malrotation.    BONES AND SOFT TISSUES:  There is no acute osseous abnormality. There  is  moderate degenerative disease at L5-S1..      Impression:          1.  Suggestion of pericholecystic edema with mild gallbladder  distention. Recommend further evaluation with right upper quadrant  ultrasound to exclude acute cholecystitis.  2.  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    Theador Hawthorne, MD   11/11/2020 7:43 PM    Abdomen Portable [[id number]] Collected: 11/11/20 1920    Order Status: Completed Updated: 11/11/20 1923    Narrative:      HISTORY: Pain    COMPARISON: None.    TECHNIQUE: Supine AP views of the abdomen and pelvis    FINDINGS:     There is a nonspecific nonobstructive bowel gas pattern. Relative  paucity of bowel gas is noted in the right hemiabdomen. There is no  supine evidence of free intraperitoneal air. There is no visualized  acute osseous abnormality.      Impression:           Nonspecific nonobstructive bowel gas pattern.    Theador Hawthorne, MD   11/11/2020 7:21 PM          Scribe and MD Attestations     Rendering Provider: Elray Mcgregor, MD          Diagnosis and Disposition     Clinical Impression  1. Choledocholithiasis    2. Calculus of gallbladder with biliary obstruction but without cholecystitis    3. Generalized abdominal pain        Disposition  ED Disposition     ED Disposition Condition Date/Time Comment    Admit  Mon Nov 11, 2020 11:38 PM Admitting Physician: Rick Duff [[id number]]   Service:: Surgery [[id number]]   Estimated Length of Stay: 3 - 5 Days   Tentative Discharge Plan?: Home or Self Care [1]   Does patient need telemetry?: No                Prescriptions       New Prescriptions    No medications on file        Elray Mcgregor, MD  11/12/20 [phone]

## 2020-11-12 NOTE — Progress Notes (Signed)
GENERAL SURGERY PROGRESS NOTE  [facility] [phone]    Date Time: 11/12/20 12:23 PM  Patient Name: [facility] Day: 1    ASSESSMENT:     Patient Active Problem List   Diagnosis    Choledocholithiasis     Resting in bed, no acute distress. LFT's and Bilirubin trending up (AST 432/ ALT 397/ T. Bilirubin 1.6 (Direct 1.1). Abdomen soft, tender at RUQ with radiation to back. VSS   PLAN:     -NPO except sips with meds  -MRCP per GI team  -Home medication reconciled  -Supportive care in place  -Pending MRCP may need ERCP with Lap cholecystectomy to follow; pt stated understanding of plan of care  -Supportive care in place        SUBJECTIVE:   The patient is doing well.  Abdominal pain is  unchanged.  Current dietary status:  Diet NPO effective now and tolerating well.  Flatus: yes. BM:  yes.  Additional complaints: none     Objective:   Current Vitals:   Vitals:    11/12/20 0812   BP: 137/87   Pulse: 81   Resp: 16   Temp: 97.7 F (36.5 C)   SpO2: 96%       Intake and Output Summary (Last 24 hours):  I/O last 3 completed shifts:  In: 1491.66 [I.V.:391.66; IV Piggyback:1100]  Out: 0     Labs:     Results     Procedure Component Value Units Date/Time    Bilirubin (Fractionated), Total & Direct [[id number]]  (Abnormal) Collected: 11/12/20 0457    Specimen: Blood Updated: 11/12/20 0545     Bilirubin Direct 1.1 mg/dL      Bilirubin Indirect 0.5 mg/dL     GFR [[id number]] Collected: 11/12/20 0457     Updated: 11/12/20 0545     EGFR >60.0    Comprehensive metabolic panel [[id number]]  (Abnormal) Collected: 11/12/20 0457    Specimen: Blood Updated: 11/12/20 0545     Glucose 91 mg/dL      BUN 10 mg/dL      Creatinine 0.8 mg/dL      Sodium 962 mEq/L      Potassium 3.9 mEq/L      Chloride 107 mEq/L      CO2 28 mEq/L      Calcium 8.9 mg/dL      Protein, Total 5.9 g/dL      Albumin 3.4 g/dL      AST (SGOT) 952 U/L      ALT 397 U/L      Alkaline Phosphatase 100 U/L      Bilirubin, Total 1.6 mg/dL      Globulin 2.5 g/dL       Albumin/Globulin Ratio 1.4     Anion Gap 6.0    COVID-19 (SARS-COV-2) ([facility]) [[id number]] Collected: 11/12/20 0036    Specimen: Nasopharyngeal Swab from Nasopharynx Updated: 11/12/20 0103     Purpose of COVID testing Screening     SARS-CoV-2 Specimen Source Nasopharyngeal     SARS CoV 2 Overall Result Negative    Narrative:      o Collect and clearly label specimen type:  o Upper respiratory specimen: One Nasopharyngeal Dry Swab NO  Transport Media.  o Hand deliver to laboratory ASAP  Indication for testing->Extended care facility admission to  semi private room  Screening    Comprehensive metabolic panel [[id number]]  (Abnormal) Collected: 11/11/20 1836    Specimen: Blood Updated: 11/11/20 1901  Glucose 142 mg/dL      BUN 18 mg/dL      Creatinine 0.8 mg/dL      Sodium 161 mEq/L      Potassium 4.0 mEq/L      Chloride 101 mEq/L      CO2 28 mEq/L      Calcium 10.2 mg/dL      Protein, Total 7.3 g/dL      Albumin 4.2 g/dL      AST (SGOT) 096 U/L      ALT 242 U/L      Alkaline Phosphatase 109 U/L      Bilirubin, Total 1.5 mg/dL      Globulin 3.1 g/dL      Albumin/Globulin Ratio 1.4     Anion Gap 9.0    Lipase [[id number]] Collected: 11/11/20 1836    Specimen: Blood Updated: 11/11/20 1901     Lipase 28 U/L     GFR [[id number]] Collected: 11/11/20 1836     Updated: 11/11/20 1901     EGFR >60.0    Urinalysis Reflex to Microscopic Exam- Reflex to Culture [[id number]]  (Abnormal) Collected: 11/11/20 1836     Updated: 11/11/20 1847     Urine Type Urine, Clean Ca     Color, UA Amber     Clarity, UA Cloudy     Specific Gravity UA 1.016     Urine pH 7.0     Leukocyte Esterase, UA Negative     Nitrite, UA Negative     Protein, UR Negative     Glucose, UA Negative     Ketones UA Negative     Urobilinogen, UA 4.0 mg/dL      Bilirubin, UA Negative     Blood, UA Negative    CBC and differential [[id number]]  (Abnormal) Collected: 11/11/20 1836    Specimen: Blood Updated: 11/11/20 1844     WBC 10.20 x10 3/uL      Hgb 13.3 g/dL       Hematocrit 41.3 %      Platelets 356 x10 3/uL      RBC 4.11 x10 6/uL      MCV 97.8 fL      MCH 32.4 pg      MCHC 33.1 g/dL      RDW 12 %      MPV 8.9 fL      Neutrophils 83.6 %      Lymphocytes Automated 9.8 %      Monocytes 5.7 %      Eosinophils Automated 0.2 %      Basophils Automated 0.4 %      Immature Granulocytes 0.3 %      Nucleated RBC 0.0 /100 WBC      Neutrophils Absolute 8.53 x10 3/uL      Lymphocytes Absolute Automated 1.00 x10 3/uL      Monocytes Absolute Automated 0.58 x10 3/uL      Eosinophils Absolute Automated 0.02 x10 3/uL      Basophils Absolute Automated 0.04 x10 3/uL      Immature Granulocytes Absolute 0.03 x10 3/uL      Absolute NRBC 0.00 x10 3/uL           Rads:     Radiology Results (24 Hour)     Procedure Component Value Units Date/Time    MRI Abdomen W WO Contrast MRCP [[id number]] Resulted: 11/12/20 1211    Order Status: Sent Updated: 11/12/20 1211    US Abdomen Limited RUQ [[id number]] Collected: 11/11/20  2152    Order Status: Completed Updated: 11/11/20 2158    Narrative:      HISTORY: Abdominal pain, CT findings suggesting workup for acute  cholecystitis    COMPARISON: CT abdomen pelvis performed at 7:41 PM    TECHNIQUE: Right upper quadrant ultrasound.    FINDINGS:    LIVER: Normal echotexture. No sonographic evidence of masses or  intrahepatic biliary dilatation.    GALLBLADDER: There are multiple echogenic layering stones which are  mobile the gallbladder. There is no evidence of wall thickening. There  is trace pericholecystic fluid/edema. Sonographic Murphy's sign could  not be evaluated as the patient was medicated.    COMMON BILE DUCT: 6 mm in maximum dimension.    PANCREAS: No abnormality of the visualized portion.    RIGHT KIDNEY: 10.8 cm in length.  No hydronephrosis, cysts or shadowing  stones.    AORTA/IVC: Aorta is normal in caliber. Proximal inferior vena cava is  seen.    PERITONEUM: No free fluid in the right upper quadrant.      Impression:           Cholelithiasis  with pericholecystic edema, without gallbladder wall  thickening. The common bile duct is dilated up to 6 mm. Recommend  further evaluation with MRI to evaluate for choledocholithiasis.    Theador Hawthorne, MD   11/11/2020 9:56 PM    CT Abd/Pelvis with IV Contrast only [[id number]] Collected: 11/11/20 1937    Order Status: Completed Updated: 11/11/20 1945    Narrative:      HISTORY: Acute nonlocalized abdominal pain with history of hysterectomy.    COMPARISON: None.    TECHNIQUE: CT abdomen and pelvis WITH contrast. 100 mL IV Omnipaque 350  was administered. Oral contrast was not administered. The following dose  reduction techniques were utilized: Automated exposure control and/or  adjustment of the mA and/or kV according to patient size, and the use of  iterative reconstruction technique.    FINDINGS:     LINES/TUBES: None.    LOWER THORAX:  There are dependent changes in the lung bases.    LIVER/BILIARY TREE:  No mass or intrahepatic biliary dilatation. No  gallstones are seen. There is suggestion of pericholecystic edema with  mild distention.  SPLEEN: No splenomegaly.  PANCREAS:  No pancreatic mass or duct dilatation.    KIDNEY/URETERS: No hydronephrosis, stones or solid mass lesions.  ADRENALS:  No adrenal mass.  PELVIC ORGANS/BLADDER: No pelvic masses.    PERITONEUM/RETROPERITONEUM: No free air or fluid.  LYMPH NODES: No lymphadenopathy.  VESSELS: No aortic aneurysm. There is made of mild periportal edema.    GI TRACT:  No bowel dilation. There is made of intestinal malrotation.    BONES AND SOFT TISSUES:  There is no acute osseous abnormality. There is  moderate degenerative disease at L5-S1..      Impression:          1.  Suggestion of pericholecystic edema with mild gallbladder  distention. Recommend further evaluation with right upper quadrant  ultrasound to exclude acute cholecystitis.  2.  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    Theador Hawthorne, MD   11/11/2020 7:43 PM    Abdomen  Portable [[id number]] Collected: 11/11/20 1920    Order Status: Completed Updated: 11/11/20 1923    Narrative:      HISTORY: Pain    COMPARISON: None.    TECHNIQUE: Supine AP views of the abdomen and pelvis    FINDINGS:     There  is a nonspecific nonobstructive bowel gas pattern. Relative  paucity of bowel gas is noted in the right hemiabdomen. There is no  supine evidence of free intraperitoneal air. There is no visualized  acute osseous abnormality.      Impression:           Nonspecific nonobstructive bowel gas pattern.    Theador Hawthorne, MD   11/11/2020 7:21 PM            Medications:     Current Facility-Administered Medications   Medication Dose Route Frequency    DULoxetine  60 mg Oral Daily    estradiol  0.5 mg Oral BID Meals    piperacillin-tazobactam  4.5 g Intravenous Q8H    traZODone  62.5 mg Oral QHS      sodium chloride Stopped (11/12/20 0150)    dextrose 5 % and 0.45% NaCl 100 mL/hr at 11/12/20 1053     HYDROmorphone, ketorolac, morphine, naloxone, ondansetron, ondansetron, SUMAtriptan     Physical Exam:     General appearance - alert, well appearing, and in no distress  Mental status - alert, oriented to person, place, and time  Chest - no tachypnea, retractions or cyanosis  Heart - normal rate and regular rhythm  Abdomen - soft, tender at RUQ, nondistended, no masses or organomegaly  Extremities - peripheral pulses normal, no pedal edema, no clubbing or cyanosis    Signed by: Tamala Bari, FNP

## 2020-11-12 NOTE — Plan of Care (Signed)
Aox4. Pt complained of nausea and abdominal pain. PRN IV Toradol given,  however it was not effective. Pt's pain level still at 7/10. PRN IV Morphine given. Assisted with ambulation, encouraged IS use. After an hour, pt verbalized decrease in pain level, seen resting on bed. Refused scheduled Trazadone PO. Pt made aware of possible OR tomorrow at 10:30am. NPO status maintained. In stable condition. Report given to RN Lodronio Clydie Braun.     Problem: Safety  Goal: Patient will be free from injury during hospitalization  Outcome: Progressing  Flowsheets (Taken 11/12/2020 0516 by Suszanne Finch, RN)  Patient will be free from injury during hospitalization:   Assess patient's risk for falls and implement fall prevention plan of care per policy   Use appropriate transfer methods   Ensure appropriate safety devices are available at the bedside   Provide and maintain safe environment   Hourly rounding     Problem: Pain  Goal: Pain at adequate level as identified by patient  Outcome: Progressing  Flowsheets (Taken 11/12/2020 0516 by Suszanne Finch, RN)  Pain at adequate level as identified by patient:   Identify patient comfort function goal   Reassess pain within 30-60 minutes of any procedure/intervention, per Pain Assessment, Intervention, Reassessment (AIR) Cycle   Assess pain on admission, during daily assessment and/or before any "as needed" intervention(s)   Evaluate if patient comfort function goal is met   Evaluate patient's satisfaction with pain management progress     Problem: Side Effects from Pain Analgesia  Goal: Patient will experience minimal side effects of analgesic therapy  Outcome: Progressing  Flowsheets (Taken 11/12/2020 2314)  Patient will experience minimal side effects of analgesic therapy:   Prevent/manage side effects per LIP orders (i.e. nausea, vomiting, pruritus, constipation, urinary retention, etc.)   Evaluate for opioid-induced sedation with appropriate assessment tool (i.e. POSS)    Monitor/assess patient's respiratory status (RR depth, effort, breath sounds)   Assess for changes in cognitive function

## 2020-11-12 NOTE — Plan of Care (Signed)
Problem: Safety  Goal: Patient will be free from injury during hospitalization  Outcome: Progressing  Flowsheets (Taken 11/12/2020 0516)  Patient will be free from injury during hospitalization:   Assess patient's risk for falls and implement fall prevention plan of care per policy   Use appropriate transfer methods   Ensure appropriate safety devices are available at the bedside   Provide and maintain safe environment   Hourly rounding     Problem: Pain  Goal: Pain at adequate level as identified by patient  Outcome: Progressing  Flowsheets (Taken 11/12/2020 0516)  Pain at adequate level as identified by patient:   Identify patient comfort function goal   Reassess pain within 30-60 minutes of any procedure/intervention, per Pain Assessment, Intervention, Reassessment (AIR) Cycle   Assess pain on admission, during daily assessment and/or before any "as needed" intervention(s)   Evaluate if patient comfort function goal is met   Evaluate patient's satisfaction with pain management progress     Problem: Moderate/High Fall Risk Score >5  Goal: Patient will remain free of falls  Outcome: Progressing  Flowsheets (Taken 11/12/2020 0516)  High (Greater than 13): HIGH-Bed alarm on at all times while patient in bed    Patient admitted from ED, VSS, voiding adequately in bathroom, remains NPO and started on continuous IVF. Pain well controlled with IV morphine and nausea controlled with zofran. Bedside table and call bell within reach patient educated on calling when needing assistance to get out of bed.

## 2020-11-12 NOTE — Progress Notes (Signed)
Case Management Initial Assessment and Discharge Planning:    I  Situation Crites,Stacy Cummings,49 y.o.,female  HospitalDay:1  Admitting DX: Choledocholithiasis [K80.50]  Generalized abdominal pain [R10.84]  Calculus of gallbladder with biliary obstruction but without cholecystitis [K80.21]    Lace Score:   Background   DME: N/A  Home Health:  N/A  Transportation: N/A  Out Patient: N/A     Assessment ED SW met w/pt at bedside and introduced self/role. Pt is here visiting w/her boyfriend locally in [place]. Pt is from [place], [place] and all of her care in provided there. PCP is Dr. Wynelle Link. Pt will be staying w/boyfriend at discharge in a multi-level [facility]. Pt was independent prior to admission and does not need any assistance w/ADLs. PT denies any prior admission to SNF or Rehab or Home Health Services. Unit CM to follow for dispo needs. ED SW confirmed demographics.     Discharge Barriers identified upon initial assessment: None   Recommendation   Possible post-hospitalization need based on patient's GOC and treatment plan:           Home        CM will continue to monitor patient's progression of care, d/c needs and possible barriers to discharge.            11/12/20 1602   Patient Type   Within 30 Days of Previous Admission? No   Healthcare Decisions   Interviewed: Patient   Orientation/Decision Making Abilities of Patient Alert and Oriented x3, able to make decisions   Healthcare Agent Appointed No   Prior to admission   Prior level of function Independent with ADLs   Type of Residence Private residence   Home Layout Multi-level   Have running water, electricity, heat, etc? Yes   Living Arrangements Spouse/significant other   How do you get to your MD appointments? Self   How do you get your groceries? Self   Who fixes your meals? Self   Who does your laundry? Self   Who picks up your prescriptions? SElf   Discharge Planning   Support Systems Spouse/significant other   Patient expects to be discharged to: Home    Anticipated [place] plan discussed with: Same as interviewed   Mode of transportation: Private car (family member)       Stacy Cummings, MSW, LMSW  Emergency Room Case Manager/Social Worker   [facility] - Case Management  [phone]

## 2020-11-12 NOTE — UM Notes (Addendum)
PRESENTED TO ED: 11/11/2020  6:22 PM      ADMITTED TO CLASS:  Inpatient   UNIT:  SURGICAL    11/11/20 2338  Adult Admit to Inpatient  Once        Diagnosis: Choledocholithiasis    Level of Care: Acute    Patient Class: Inpatient       References:IAH Bed Placement CriteriaIFMC Bed Placement CriteriaIFOH Bed Placement CriteriaILH Bed Placement CriteriaIMVH Bed Placement Criteria   Question Answer Comment   Admitting Physician Rick Duff    Service: Surgery    Estimated Length of Stay 3 - 5 Days    Tentative Discharge Plan? Home or Self Care    Does patient need telemetry? No                    Reason For Hospitalization:  49 y.o. female p/w severe abdominal pain, nausea, and bloating.    Labs show leukocytosis, thrombocytosis, elevated LFTs, urobilinogen.   Imaging shows cholelithiasis, likely cholecystitis, and likely choledocholithiasis with dilated CBD.       PMH:  has a past medical history of Migraine.      ED Triage Vitals      BP (!) 168/95      Heart Rate (!) 59      Resp Rate 18      Temp 98.2 F (36.8 C)      Temp Source Oral      SpO2 99 %      Weight 80.2 kg (176 lb 12.9 oz)      Height 1.651 m (5\' 5" )      Pain Score 8         LABS:    WBC 10.20 (H)  Plt 356 (H)  Glucose 142 (H)  AST 326 (H)  ALT 242 (H)  Alk phos 109 (H)  Bili total 1.5 (H)      Urinalysis Reflex to Microscopic Exam- Reflex to Culture [[id number]]  (Abnormal) Collected: 11/11/20 1836      Updated: 11/11/20 1847     Urine Type Urine, Clean Ca     Color, UA Amber     Clarity, UA Cloudy     Specific Gravity UA 1.016     Urine pH 7.0     Leukocyte Esterase, UA Negative     Nitrite, UA Negative     Protein, UR Negative     Glucose, UA Negative     Ketones UA Negative     Urobilinogen, UA 4.0 mg/dL            IMAGING:  CT Abd/Pelvis with IV Contrast only    Result Date: 11/11/2020  1.  Suggestion of pericholecystic edema with mild gallbladder distention. Recommend further evaluation with  right upper quadrant ultrasound to exclude acute cholecystitis. 2.  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 Theador Hawthorne, MD  11/11/2020 7:43 PM    US Abdomen Limited RUQ    Result Date: 11/11/2020   Cholelithiasis with pericholecystic edema, without gallbladder wall thickening. The common bile duct is dilated up to 6 mm. Recommend further evaluation with MRI to evaluate for choledocholithiasis. Theador Hawthorne, MD  11/11/2020 9:56 PM    Abdomen Portable    Result Date: 11/11/2020   Nonspecific nonobstructive bowel gas pattern. Theador Hawthorne, MD  11/11/2020 7:21 PM         ED MEDS:  1L NS IV bolus   zofran 4mg  IV x 2  Morphine 4mg  IV  x 2  Invanz 1g IV  Zosyn 4.5g IV        PLAN:  - admit to general surgery   - Consult GI for possible ERCP in am, if labs worsen including t bili most likely choledocholithiasis  - Check labs in am   - Recommend zosyn IV  - keep NPO  - discussed with pt about recommendation for lap cholecystectomy, most likely during this hospital admission depending on the above plans  - will follow with pt in am       Current Facility-Administered Medications   Medication Dose Route Frequency    DULoxetine  60 mg Oral Daily    estradiol  0.5 mg Oral BID Meals    piperacillin-tazobactam  4.5 g Intravenous Q8H    traZODone  62.5 mg Oral QHS      sodium chloride Stopped (11/12/20 0150)    dextrose 5 % and 0.45% NaCl 100 mL/hr at 11/12/20 0150           ==========Day 2:  November 12, 2020==========  Vitals:    11/12/20 0130 11/12/20 0152 11/12/20 0410 11/12/20 0812   BP: 128/79  111/74 137/87   Pulse: 61  60 81   Resp: 16  16 16    Temp: 97.8 F (36.6 C)  97.5 F (36.4 C) 97.7 F (36.5 C)   TempSrc: Oral  Oral Oral   SpO2: 98%  96% 96%   Weight:  79.4 kg (175 lb)     Height:  1.651 m (5\' 5" )            LABS 11/23:  Alb 3.4 (L)  AST 432 (H)  ALT 397 (H)  Bili total 1.6 (H)  Bili direct 1.1 (H)      MRI Abdomen W WO Contrast MRCP 11/12/2020 1:06 PM:   1. Distended gallbladder  filled with innumerable small layering calculi.  2. Trace pericholecystic fluid; no appreciable gallbladder wall  thickening.  3. Normal caliber bile ducts with no detectable choledocholithiasis.        Current Facility-Administered Medications   Medication Dose Route Frequency    DULoxetine  60 mg Oral Daily    estradiol  0.5 mg Oral BID Meals    piperacillin-tazobactam  4.5 g Intravenous Q8H    traZODone  62.5 mg Oral QHS      sodium chloride Stopped (11/12/20 0150)    dextrose 5 % and 0.45% NaCl 100 mL/hr at 11/12/20 0150       PRNs given 11/23:  zofran IV x 2, morphine 2mg  IV x 2, dilaudid 0.5mg  IV x 2, toradol 15mg  IV x 1.     Requiring frequent IV pain and nausea meds.      PER SURGERY:  LFT's and Bilirubin trending up (AST 432/ ALT 397/ T. Bilirubin 1.6 (Direct 1.1). Abdomen soft, tender at RUQ with radiation to back.   PLAN:     -NPO except sips with meds  -MRCP per GI team  -Supportive care in place  -Pending MRCP may need ERCP with Lap cholecystectomy to follow; pt stated understanding of plan of care  -Supportive care in place      PER GI:   She currently complains of a headache and mid back pain radiating into her lower back.  Assessment:  1. Abdominal pain-Acute onset, 11/22 AM with radiation to back. Abdominal imaging notable for cholelithiasis with suggestion of acute cholecystitis. Common bile duct normal at 6 mm however, can not exclude microlithiasis vs biliary sludge given  direct hyperbilirubinemia.   2. Transaminitis with direct hyperbilirubinemia-Baseline unknown, however, presenting with mixed hepatocellular and cholestatic pattern. It is possible that she passed gallstones. MRCP negative for choledocholithiasis.   3. Cholelithiasis-Noted on abdominal imaging.   Plan:  1.  MRI/MRCP with normal caliber bile ducts with no detectable choledocholithiasis. Therefore, ERCP not indicated.  2.  Surgery planning eventual cholecystectomy this admission.                                                                                                                                                                                      ** This clinical review is compiled from documentation provided by the treatment team in the medical record. Lanora Manis "Lucas Mallow, RN, BSN, ACM-RN  Utilization Review Case Manager II  [facility]  [address]  [place], [place] [address]  Phone:  [phone]  Fax:  [phone]  Case Management Department Main Phone:  [phone]     Tax ID:  [id number]  NPI:  [id number]     Please use fax number [phone] to provide authorization for hospital services or to request additional information.

## 2020-11-12 NOTE — Consults (Signed)
[facility]  CONSULTATION NOTE  [facility] call: [phone]  [facility] call: [phone]  [facility] call: [phone]  After hours call [phone]        Date Time: 11/12/20 3:37 PM  Patient Name: Stacy Cummings  Requesting Physician: Rick Duff,*       Reason for Consultation:   Abdominal pain  Transaminitis  Cholelithiasis    Assessment and Plan:   Assessment:  1. Abdominal pain-Acute onset, 11/22 AM with radiation to back. Abdominal imaging notable for cholelithiasis with suggestion of acute cholecystitis. Common bile duct normal at 6 mm however, can not exclude microlithiasis vs biliary sludge given direct hyperbilirubinemia.   2. Transaminitis with direct hyperbilirubinemia-Baseline unknown, however, presenting with mixed hepatocellular and cholestatic pattern. It is possible that she passed gallstones. MRCP negative for choledocholithiasis.   3. Cholelithiasis-Noted on abdominal imaging.     Plan:  1.  MRI/MRCP with normal caliber bile ducts with no detectable choledocholithiasis. Therefore, ERCP not indicated.  2.  Surgery planning eventual cholecystectomy this admission.    GI will sign off, please call with questions.    History:   Stacy Cummings is a 49 y.o. female with history of migraine headaches who presents to the hospital on 11/11/2020 with acute onset abdominal pain. She reports that yesterday morning she ate Special [employer] cereal, toast and coffee. Shortly after she had nausea which was followed by abdominal bloating and 4 soft, non-bloody BMs. Her abdomen then became painful and sensitive to touch. She then started having upper abdominal "tenderness" with radiation to her back. She reports that she has never had anything like this before. Pain gradually worsened with radiation to her back. She had an episode of bilious regurgitation but no nausea or vomiting. She drinks alcohol socially, last had 1-2 glasses of wine on Monday. She currently complains of a  headache and mid back pain radiating into her lower back. Abdominal pain has nearly resolved. Denies fevers, chills, sweats. She has had a hysterectomy but no other abdominal surgeries.     Upon admission, tachycardic 59*, BP 168/95. Labs showed leukocytosis 10.20, thrombocytosis 356* and AST 326*, ALT 242*, ALP 109*, t bili 1.5*. Normal lipase.    Past Medical History:     Past Medical History:   Diagnosis Date    Migraine        Past Surgical History:     Past Surgical History:   Procedure Laterality Date    BLADDER REPAIR      HYSTERECTOMY         Family History:   History reviewed. No pertinent family history.    Social History:     Social History     Socioeconomic History    Marital status: Married     Spouse name: Not on file    Number of children: Not on file    Years of education: Not on file    Highest education level: Not on file   Occupational History    Not on file   Tobacco Use    Smoking status: Never Smoker    Smokeless tobacco: Never Used   Substance and Sexual Activity    Alcohol use: Yes     Alcohol/week: 2.0 standard drinks     Types: 2 Glasses of wine per week    Drug use: Never    Sexual activity: Not on file   Other Topics Concern    Not on file   Social History Narrative    Not on file  Social Determinants of Health     Financial Resource Strain:     Difficulty of Paying Living Expenses: Not on file   Food Insecurity:     Worried About Programme researcher, broadcasting/film/video in the Last Year: Not on file    [employer] of Food in the Last Year: Not on file   Transportation Needs:     Lack of Transportation (Medical): Not on file    Lack of Transportation (Non-Medical): Not on file   Physical Activity:     Days of Exercise per Week: Not on file    Minutes of Exercise per Session: Not on file   Stress:     Feeling of Stress : Not on file   Social Connections:     Frequency of Communication with Friends and Family: Not on file    Frequency of Social Gatherings with Friends and Family: Not on  file    Attends Religious Services: Not on file    Active Member of Clubs or Organizations: Not on file    Attends Banker Meetings: Not on file    Marital Status: Not on file   Intimate Partner Violence:     Fear of Current or Ex-Partner: Not on file    Emotionally Abused: Not on file    Physically Abused: Not on file    Sexually Abused: Not on file   Housing Stability:     Unable to Pay for Housing in the Last Year: Not on file    Number of Places Lived in the Last Year: Not on file    Unstable Housing in the Last Year: Not on file       Allergies:     Allergies   Allergen Reactions    Decadron [Dexamethasone] Other (See Comments)     Esophageal acid reflux    Pseudoephedrine Palpitations       Medications:     Current Facility-Administered Medications   Medication Dose Route Frequency    DULoxetine  60 mg Oral Daily    estradiol  0.5 mg Oral BID Meals    piperacillin-tazobactam  4.5 g Intravenous Q8H    traZODone  62.5 mg Oral QHS       Review of Systems:   General:  Patient denies lack of appetite, night sweats, weight loss, fatigue, fever.   HEENT:  Patient denies headache, hoarseness   Cardiovascular:  Patient denies swelling of hands/feet, fainting/blacking out, chest pain.   Respiratory:  Patient denies chronic cough, difficulty breathing, wheezing.   Genitourinary:  Patient denies blood in urine, dark urine  Musculoskeletal: Patient denies joint pain, joint stiffness, joint swelling.   Skin:  Patient denies itching, rash.   Neurologic:  Patient denies dizziness, loss of consciousness, fainting, confusion  Heme/Lymphatic:  Patient denies easy bruising.       Pertinent positives noted in HPI.    Physical Exam:     Vitals:    11/12/20 1315   BP: 134/84   Pulse: 64   Resp: 16   Temp: 97.8 F (36.6 C)   SpO2: 96%       General appearance: Well developed, well nourished, appears stated age and in NAD  Eyes: Sclera anicteric, pink conjunctivae, no ptosis  ENMT: mucous membranes  moist, nose and ears appear normal.  Oropharynx clear.  Chest: Non labored respirations, no audible wheezing, no clubbing or cyanosis  CV:  Regular rate and rhythm, no JVD, no LE edema  Abdomen: soft, non-tender, non-distended, no  masses or organomegaly  Skin: Normal color and turgor, no rashes, no suspicious skin lesions noted  Neuro: CN II-XII grossly intact.  No gross movement disorders noted.  Mental status: Appropriate affect, alert and oriented x 3    Labs Reviewed:     Recent Labs     11/11/20  1836   WBC 10.20*   Hgb 13.3   Hematocrit 40.2   Platelets 356*   MCV 97.8*       Recent Labs     11/12/20  0457 11/11/20  1836   Sodium 141 138   Potassium 3.9 4.0   Chloride 107 101   CO2 28 28   BUN 10 18   Creatinine 0.8 0.8   Glucose 91 142*   Calcium 8.9 10.2       Recent Labs     11/12/20  0457 11/11/20  1836   AST (SGOT) 432* 326*   ALT 397* 242*   Alkaline Phosphatase 100 109*   Bilirubin, Total 1.6* 1.5*   Bilirubin Direct 1.1*  --    Protein, Total 5.9* 7.3   Albumin 3.4* 4.2       No results for input(s): PTT, PT, INR in the last 72 hours.     Radiology:   Radiological Procedure reviewed:    CT A/P w IV con 11/11/20-Suggestion of pericholecystic edema with mild gallbladder  distention. Recommend further evaluation with right upper quadrant  ultrasound to exclude acute cholecystitis.  2.  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    US abdomen RUQ 11/11/20-There are multiple echogenic layering stones which are  mobile the gallbladder. There is no evidence of wall thickening. There  is trace pericholecystic fluid/edema. Sonographic Murphy's sign could  not be evaluated as the patient was medicated.  COMMON BILE DUCT: 6 mm in maximum dimension.

## 2020-11-13 ENCOUNTER — Inpatient Hospital Stay: Payer: No Typology Code available for payment source | Admitting: Anesthesiology

## 2020-11-13 ENCOUNTER — Emergency Department
Admission: EM | Admit: 2020-11-13 | Discharge: 2020-11-14 | Disposition: A | Discharge status: Home / Self Care | Payer: No Typology Code available for payment source | Attending: Emergency Medicine | Admitting: Emergency Medicine

## 2020-11-13 ENCOUNTER — Encounter: Payer: Self-pay | Admitting: Surgery

## 2020-11-13 ENCOUNTER — Encounter
Admission: EM | Disposition: A | Discharge status: Home / Self Care | Payer: Self-pay | Source: Home / Self Care | Attending: Surgery

## 2020-11-13 ENCOUNTER — Inpatient Hospital Stay: Payer: No Typology Code available for payment source

## 2020-11-13 ENCOUNTER — Ambulatory Visit: Payer: Self-pay

## 2020-11-13 DIAGNOSIS — K8019 Calculus of gallbladder with other cholecystitis with obstruction: Secondary | ICD-10-CM

## 2020-11-13 DIAGNOSIS — R339 Retention of urine, unspecified: Secondary | ICD-10-CM | POA: Insufficient documentation

## 2020-11-13 HISTORY — PX: LAPAROSCOPIC, CHOLECYSTECTOMY, CHOLANGIOGRAM: SHX4475

## 2020-11-13 HISTORY — PX: CHOLECYSTECTOMY, LAPAROSCOPIC: SHX56

## 2020-11-13 SURGERY — LAPAROSCOPIC, CHOLECYSTECTOMY, CHOLANGIOGRAM
Anesthesia: Anesthesia General | Site: Abdomen | Wound class: Clean Contaminated

## 2020-11-13 MED ORDER — MEPERIDINE HCL 25 MG/ML IJ SOLN
25.0000 mg | Freq: Once | INTRAMUSCULAR | Status: DC | PRN
Start: 2020-11-13 — End: 2020-11-13

## 2020-11-13 MED ORDER — PROPOFOL 10 MG/ML IV EMUL (WRAP)
INTRAVENOUS | Status: AC
Start: 2020-11-13 — End: ?
  Filled 2020-11-13: qty 40

## 2020-11-13 MED ORDER — MIDAZOLAM HCL 1 MG/ML IJ SOLN (WRAP)
INTRAMUSCULAR | Status: AC
Start: 2020-11-13 — End: ?
  Filled 2020-11-13: qty 2

## 2020-11-13 MED ORDER — OXYCODONE-ACETAMINOPHEN 5-325 MG PO TABS
1.0000 | ORAL_TABLET | Freq: Once | ORAL | Status: DC | PRN
Start: 2020-11-13 — End: 2020-11-13

## 2020-11-13 MED ORDER — ONDANSETRON HCL 4 MG/2ML IJ SOLN
INTRAMUSCULAR | Status: DC | PRN
Start: 2020-11-13 — End: 2020-11-13
  Administered 2020-11-13: 4 mg via INTRAVENOUS

## 2020-11-13 MED ORDER — ROCURONIUM BROMIDE 10 MG/ML IV SOLN (WRAP)
INTRAVENOUS | Status: DC | PRN
Start: 2020-11-13 — End: 2020-11-13
  Administered 2020-11-13: 10 mg via INTRAVENOUS
  Administered 2020-11-13: 25 mg via INTRAVENOUS
  Administered 2020-11-13: 5 mg via INTRAVENOUS

## 2020-11-13 MED ORDER — ONDANSETRON HCL 4 MG/2ML IJ SOLN
4.0000 mg | Freq: Once | INTRAMUSCULAR | Status: DC | PRN
Start: 2020-11-13 — End: 2020-11-13

## 2020-11-13 MED ORDER — BUPIVACAINE HCL (PF) 0.25 % IJ SOLN
INTRAMUSCULAR | Status: DC | PRN
Start: 2020-11-13 — End: 2020-11-13
  Administered 2020-11-13: 20 mL

## 2020-11-13 MED ORDER — NEOSTIGMINE METHYLSULFATE 1 MG/ML IJ/IV SOLN (WRAP)
Status: DC | PRN
Start: 2020-11-13 — End: 2020-11-13
  Administered 2020-11-13: 4 mg via INTRAVENOUS

## 2020-11-13 MED ORDER — SODIUM CHLORIDE 0.9 % IR SOLN
Status: DC | PRN
Start: 2020-11-13 — End: 2020-11-13
  Administered 2020-11-13: 1000 mL

## 2020-11-13 MED ORDER — GLYCOPYRROLATE 1 MG/5ML IJ SOLN
INTRAMUSCULAR | Status: AC
Start: 2020-11-13 — End: ?
  Filled 2020-11-13: qty 5

## 2020-11-13 MED ORDER — FENTANYL CITRATE (PF) 50 MCG/ML IJ SOLN (WRAP)
INTRAMUSCULAR | Status: AC
Start: 2020-11-13 — End: ?
  Filled 2020-11-13: qty 2

## 2020-11-13 MED ORDER — ROCURONIUM BROMIDE 50 MG/5ML IV SOLN
INTRAVENOUS | Status: AC
Start: 2020-11-13 — End: ?
  Filled 2020-11-13: qty 5

## 2020-11-13 MED ORDER — LACTATED RINGERS IV SOLN
INTRAVENOUS | Status: DC
Start: 2020-11-13 — End: 2020-11-13

## 2020-11-13 MED ORDER — MIDAZOLAM HCL 1 MG/ML IJ SOLN (WRAP)
INTRAMUSCULAR | Status: DC | PRN
Start: 2020-11-13 — End: 2020-11-13
  Administered 2020-11-13: 2 mg via INTRAVENOUS

## 2020-11-13 MED ORDER — BUPIVACAINE HCL (PF) 0.25 % IJ SOLN
INTRAMUSCULAR | Status: AC
Start: 2020-11-13 — End: ?
  Filled 2020-11-13: qty 30

## 2020-11-13 MED ORDER — FENTANYL CITRATE (PF) 50 MCG/ML IJ SOLN (WRAP)
INTRAMUSCULAR | Status: DC | PRN
Start: 2020-11-13 — End: 2020-11-13
  Administered 2020-11-13 (×3): 50 ug via INTRAVENOUS

## 2020-11-13 MED ORDER — HYDROMORPHONE HCL 2 MG PO TABS
2.0000 mg | ORAL_TABLET | Freq: Once | ORAL | Status: DC | PRN
Start: 2020-11-13 — End: 2020-11-13

## 2020-11-13 MED ORDER — FENTANYL CITRATE (PF) 50 MCG/ML IJ SOLN (WRAP)
25.0000 ug | INTRAMUSCULAR | Status: AC | PRN
Start: 2020-11-13 — End: 2020-11-13
  Administered 2020-11-13 (×4): 25 ug via INTRAVENOUS
  Filled 2020-11-13: qty 2

## 2020-11-13 MED ORDER — OXYCODONE HCL 5 MG PO TABS
5.0000 mg | ORAL_TABLET | ORAL | 0 refills | Status: AC | PRN
Start: 2020-11-13 — End: 2020-11-16

## 2020-11-13 MED ORDER — NEOSTIGMINE METHYLSULFATE 1 MG/ML IJ/IV SOLN (WRAP)
Status: AC
Start: 2020-11-13 — End: ?
  Filled 2020-11-13: qty 10

## 2020-11-13 MED ORDER — GLYCOPYRROLATE 0.2 MG/ML IJ SOLN (WRAP)
INTRAMUSCULAR | Status: DC | PRN
Start: 2020-11-13 — End: 2020-11-13
  Administered 2020-11-13: .8 mg via INTRAVENOUS

## 2020-11-13 MED ORDER — IOTHALAMATE MEGLUMINE 60 % IJ SOLN
INTRAMUSCULAR | Status: DC | PRN
Start: 2020-11-13 — End: 2020-11-13
  Administered 2020-11-13: 9 mL

## 2020-11-13 MED ORDER — PROPOFOL 10 MG/ML IV EMUL (WRAP)
INTRAVENOUS | Status: DC | PRN
Start: 2020-11-13 — End: 2020-11-13
  Administered 2020-11-13: 200 mg via INTRAVENOUS

## 2020-11-13 MED ORDER — LIDOCAINE HCL 2 % IJ SOLN
INTRAMUSCULAR | Status: DC | PRN
Start: 2020-11-13 — End: 2020-11-13
  Administered 2020-11-13: 3 mL

## 2020-11-13 MED ORDER — PROMETHAZINE HCL 25 MG/ML IJ SOLN
6.2500 mg | Freq: Once | INTRAMUSCULAR | Status: DC | PRN
Start: 2020-11-13 — End: 2020-11-13

## 2020-11-13 MED ORDER — ONDANSETRON HCL 4 MG/2ML IJ SOLN
INTRAMUSCULAR | Status: AC
Start: 2020-11-13 — End: ?
  Filled 2020-11-13: qty 2

## 2020-11-13 MED ORDER — LACTATED RINGERS IR SOLN
Status: AC | PRN
Start: 2020-11-13 — End: 2020-11-13
  Administered 2020-11-13: 1000 mL

## 2020-11-13 MED ORDER — HYDROMORPHONE HCL 0.5 MG/0.5 ML IJ SOLN
0.5000 mg | INTRAMUSCULAR | Status: DC | PRN
Start: 2020-11-13 — End: 2020-11-13

## 2020-11-13 SURGICAL SUPPLY — 97 items
ADHESIVE SKIN CLOSURE DERMABOND ADVANCED (Skin Closure) ×1
ADHESIVE SKIN CLOSURE DERMABOND ADVANCED .7 ML LIQUID APPLICATOR (Skin Closure) ×1 IMPLANT
ADHESIVE SKNCLS 2 OCTYL CYNCRLT .7ML (Skin Closure) ×1
APPLICATOR CHLORAPREP 26 ML 70% ISOPROPYL ALCOHOL 2% CHLORHEXIDINE (Applicator) ×1 IMPLANT
APPLICATOR PRP 70% ISPRP 2% CHG 26ML (Applicator) ×2
APPLIER IN CLP TI MED LG E-CLP III SUP (Staplers) ×1
APPLIER INTERNAL CLIP MEDIUM LARGE L33 (Staplers) ×1
APPLIER INTERNAL CLIP MEDIUM LARGE L33 CM TITANIUM PISTOL GRIP GLARE (Staplers) ×1 IMPLANT
BAG SPEC RTRVL PLS 224ML LG EPCH 10MM 6 (Procedure Accessories) ×1
BAG SPECIMEN RETRIEVAL L6 IN X W4 IN (Procedure Accessories) ×1
BAG SPECIMEN RETRIEVAL L6 IN X W4 IN LARGE OD10 MM ENDOPOUCH PLASTIC (Procedure Accessories) ×1 IMPLANT
CATHETER CHLGM KUMAR CHLGR 16GA 19GA 76 (Catheter Micellaneous) ×1
CATHETER CHLGM MTL TAUT 4.5FR 18IN LF (Procedure Accessories)
CATHETER CHOLANGIOGRAM L18 IN SUPPORT (Procedure Accessories)
CATHETER CHOLANGIOGRAM L18 IN SUPPORT TUBE OD4.5 FR TAUT METAL (Procedure Accessories) IMPLANT
CATHETER CHOLANGIOGRAM L76 CM NEEDLE (Catheter Micellaneous) ×1
CATHETER CHOLANGIOGRAM L76 CM NEEDLE OD16 GA ODSEC19 GA KUMAR (Catheter Miscellaneous) IMPLANT
CATHETER IV JELCO 14GA 2IN STRL RADOPQ (IV Supply) ×1
CATHETER IV OD14 GA L2 IN RADIOPAQUE (IV Supply) ×1
CATHETER IV OD14 GA L2 IN RADIOPAQUE JELCO (IV Supply) IMPLANT
DISSECTOR ENDOSCOPIC L38 CM 1 TIP RADIOPAQUE BRUSH FINISH BLUNT OD5 MM (Sponge) IMPLANT
DISSECTOR ESCP SS KTNR 5MM 38CM LF STRL (Sponge)
DRAPE 74X41IN UNIVERSAL POLY XRAY C ARM CLOSURE STRAP (Drape) IMPLANT
DRAPE EQP VLCR POLY UNV STRDRP 74X41IN (Drape) ×2
DRAPE SRG SMS .75 PRXM 77X53IN LF STRL (Drape) ×1
DRAPE SURGICAL SHEET L77 IN X W53 IN (Drape) ×1
DRAPE SURGICAL SHEET L77 IN X W53 IN MEDLINE SMS 3/4 BLUE (Drape) IMPLANT
ELECTRODE ADULT PATIENT RETURN L9 FT REM POLYHESIVE ACRYLIC FOAM (Procedure Accessories) ×1 IMPLANT
ELECTRODE ELECTROSURGICAL L HOOK L36 CM (Procedure Accessories) ×1
ELECTRODE ELECTROSURGICAL L HOOK L36 CM CLEANCOAT PTFE COATED (Procedure Accessories) ×1 IMPLANT
ELECTRODE ESURG PTFE LHK CLEANCOAT 36CM (Procedure Accessories) ×1
ELECTRODE PATIENT RETURN L9 FT VALLEYLAB (Procedure Accessories) ×1
ELECTRODE PT RTN RM PHSV ACRL FM C30- LB (Procedure Accessories) ×1
GLOVE SRG 7.5 BGL SRG LTX STRL PF BEAD (Glove) ×1
GLOVE SRG PLISPRN 8 BGL PI INDCTR (Glove) ×1
GLOVE SURGICAL 7 1/2 BIOGEL SURGEONS (Glove) ×1
GLOVE SURGICAL 7 1/2 BIOGEL SURGEONS POWDER FREE BEAD CUFF TEXTURE (Glove) ×1 IMPLANT
GLOVE SURGICAL 8 BIOGEL PI INDICATOR (Glove) ×1
GLOVE SURGICAL 8 BIOGEL PI INDICATOR UNDERGLOVE POWDER FREE SMOOTH (Glove) ×1 IMPLANT
HANDLE LGHT ASPN LF STRL SNPON (Procedure Accessories) ×1
HANDLE LIGHT SNAP ON ASPEN (Procedure Accessories) ×1 IMPLANT
IRRIGATOR SUCTION ERGONOMIC HAND PIECE STRYKEFLOW II (Suction) IMPLANT
IRRIGATOR SUCTION STRYKEFLOW 2 (Suction) ×1
KIT INFECTION CONTROL CUSTOM (Kits) ×2
KIT INFECTION CONTROL CUSTOM IFOH03 (Kits) ×1 IMPLANT
LABEL MED LF STRL PK CSTM DISP (Other)
LABEL MEDICAL PACK CUSTOM (Other)
LABEL MEDICAL PACK CUSTOM CL22094 CUSTOM MED LABEL PACK (Other) IMPLANT
LIGATOR ESCP PDS2 0 E-LP 18IN LF MFL TIE (Suture)
LIGATOR L18 IN ENDOLOOP MONOFILAMENT TIE (Suture)
LIGATOR L18 IN ENDOLOOP MONOFILAMENT TIE LOOP SUTURE ENDOSCOPIC PDS II (Suture) IMPLANT
MANIFOLD SCT 2 STD NPTN 2 LF NS 4 PORT (Filter) ×1
MANIFOLD SUCTION 2 STANDARD 4 PORT (Filter) ×1
MANIFOLD SUCTION 2 STANDARD 4 PORT NEPTUNE 2 WASTE MANAGEMENT SYSTEM (Filter) ×1 IMPLANT
PACK SURGICAL GEN LAPAROSCOPY FFX (Pack) ×1 IMPLANT
PASSER SUT WECK EFX CLS (Procedure Accessories) ×1
SCISSORS L21 CM ENDOPATH 360 D CURVE (Instrument)
SCISSORS L21 CM ENDOPATH 360 D CURVE RATCHET HANDLE MONOPOLAR CAUTERY (Instrument) IMPLANT
SCISSORS LAPSCP 360D CRV EPTH 5MM 21CM (Instrument)
SEAL ENDOSCOPIC ADJUSTABLE BIOPSY PORT (Urology Supply) IMPLANT
SEAL ESCP LF STRL ADJ BX PORT DISP (Urology Supply)
SET HIGH FLOW SMOKE EVACUATION (Tubing) ×1
SET HIGH FLOW SMOKE EVACUATION PNEUMOCLEAR TUBING (Tubing) ×1 IMPLANT
SET TUBING PNEUMOCLEAR HFLO SMK EVAC (Tubing) ×1
SHEARS ESURG TI CRV HRMN A+ 5MM 36CM LF (Endoscopic Supplies) IMPLANT
SLEEVE LAPAROSCOPIC L75 MM UNIVERSAL (Endoscopic Supplies) ×2
SLEEVE LAPAROSCOPIC L75 MM UNIVERSAL STABILITY CANNULA RADIOPAQUE (Endoscopic Supplies) ×2 IMPLANT
SLEEVE LAPSCP UNV EPTH XCL 5MM 75MM LF (Endoscopic Supplies) ×2
SOLUTION IV LACTATED RINGERS 1000 ML (IV Solutions)
SOLUTION IV LACTATED RINGERS 1000 ML PLASTIC CONTAINER (IV Solutions) IMPLANT
SOLUTION IV LR 1000ML VFLX LF PLS CNTNR (IV Solutions)
STOPCOCK IV 4 WAY LARGE BORE ROTATE MALE (IV Supply) ×1
STOPCOCK IV 4 WAY LARGE BORE ROTATE MALE LUER LOCK ADAPTER LIPID (IV Supply) IMPLANT
STOPCOCK IV LF STRL 4W LG BORE ROT M LL (IV Supply) ×1
SUTURE ABS 0 UR-6 VCL 27IN BRD COAT VIOL (Suture)
SUTURE ABS 0 VCL 54IN BRD TIE COAT VIOL (Suture) ×2
SUTURE ABS 4-0 PS2 MNCRL MTPS 27IN MFL (Suture) ×2
SUTURE COATED VICRYL 0 L54 IN BRAID TIES (Suture) ×1 IMPLANT
SUTURE COATED VICRYL 0 UR-6 L27 IN BRAID (Suture) IMPLANT
SUTURE MONOCRYL 4-0 PS-2 L27 IN (Suture) ×2
SUTURE MONOCRYL 4-0 PS-2 L27 IN MONOFILAMENT UNDYED ABSORBABLE (Suture) ×2 IMPLANT
SUTURE PASSER WECK EFX CLASSIC (Procedure Accessories) ×1 IMPLANT
SYRINGE 20 ML BD LUER-LOK MEDICAL (Syringes, Needles) IMPLANT
SYRINGE 30 ML CONCENTRIC TIP GRADUATE (Syringes, Needles) ×1
SYRINGE 30 ML CONCENTRIC TIP GRADUATE NONPYROGENIC DEHP FREE LOK (Syringes, Needles) IMPLANT
SYRINGE 50 ML GRADUATE NONPYROGENIC DEHP (Syringes, Needles) ×1
SYRINGE 50 ML GRADUATE NONPYROGENIC DEHP FREE PVC FREE LOK MEDICAL (Syringes, Needles) IMPLANT
SYRINGE MED 20ML LL LF STRL (Syringes, Needles)
SYRINGE MED 30ML LL LF STRL CONC TIP (Syringes, Needles) ×1
SYRINGE MED 50ML LL LF STRL GRAD N-PYRG (Syringes, Needles) ×1
TRAY SRG GEN LAPAROSCOPY IFMC (Pack) ×1
TROCAR LAPAROSCOPIC BLADELESS STABILITY SLEEVE L100 MM OD12 MM (Laparoscopy Supplies) ×1 IMPLANT
TROCAR LAPAROSCOPIC BLADELESS STABILITY SLEEVE L75 MM OD5 MM ENDOPATH (Laparoscopy Supplies) ×1 IMPLANT
TROCAR LAPAROSCOPIC STABILITY SLEEVE (Laparoscopy Supplies) ×1
TROCAR LAPSCP EPTH XCL 12MM 100MM LF (Laparoscopy Supplies) ×2
TROCAR LAPSCP EPTH XCL 5MM 75MM LF STRL (Laparoscopy Supplies) ×1
TUBING SCT IRR (Suction) ×1

## 2020-11-13 NOTE — ED Provider Notes (Signed)
EMERGENCY DEPARTMENT HISTORY AND PHYSICAL EXAM     None        Date: 11/13/2020  Patient Name: Stacy Cummings    History of Presenting Illness     Chief Complaint   Patient presents with    Post-op Problem    Urinary Retention         Additional History: Stacy Cummings is a 49 y.o. female presenting to the ED with urinary retention.  Patient underwent a laparoscopic cholecystectomy today at noon and had a trickle of urination after the procedure.  She went home after recovery and has not been able to empty her bladder.  Her bladder feels full and distended and tender.  She feels uncomfortable.      PCP: Pcp, None, MD  SPECIALISTS:    No current facility-administered medications for this encounter.     Current Outpatient Medications   Medication Sig Dispense Refill    B Complex Vitamins (B COMPLEX 1 PO) Take by mouth      Diclofenac Potassium 25 MG Cap Take 25 mg by mouth      DULoxetine (CYMBALTA) 60 MG capsule Take 60 mg by mouth daily      eletriptan (RELPAX) 40 MG tablet Take 40 mg by mouth as needed for Migraine may repeat in 2 hours if necessary      estradiol (ESTRACE) 1 MG tablet Take 0.5 mg by mouth 2 (two) times daily with meals      Galcanezumab-gnlm (EMGALITY, 300 MG DOSE, SC) Inject into the skin every 30 (thirty) days      Multiple Vitamins-Minerals (Super Thera Vite M) Tab Take 1 tablet by mouth daily      OnabotulinumtoxinA (BOTOX IJ) Inject as directed every 3 (three) months      oxyCODONE (Roxicodone) 5 MG immediate release tablet Take 1 tablet (5 mg total) by mouth every 4 (four) hours as needed for Pain 5 tablet 0    Probiotic Product (Acidophilus/Goat Milk) Cap Take 1 capsule by mouth daily      Rimegepant Sulfate (Nurtec) 75 MG Tablet Dispersible Take by mouth      SUMAtriptan Succinate 3 MG/0.5ML Solution Auto-injector INJECT ONE UNDER SKIN EVERY TWO HOURS AS NEEDED. LIMIT FOUR PER DAY      traZODone (DESYREL) 50 MG tablet Take 60 mg by mouth nightly         Past  History     Past Medical History:  Past Medical History:   Diagnosis Date    Migraine        Past Surgical History:  Past Surgical History:   Procedure Laterality Date    BLADDER REPAIR      FOOT MASS EXCISION Right 2019    calcified bone or tendon per patient    HYSTERECTOMY         Family History:  History reviewed. No pertinent family history.    Social History:  Social History     Tobacco Use    Smoking status: Never Smoker    Smokeless tobacco: Never Used   Substance Use Topics    Alcohol use: Yes     Alcohol/week: 2.0 standard drinks     Types: 2 Glasses of wine per week    Drug use: Never       Allergies:  Allergies   Allergen Reactions    Decadron [Dexamethasone] Other (See Comments)     Esophageal acid reflux    Pseudoephedrine Palpitations       Review  of Systems   Review of Systems   Constitutional: Negative for chills and fever.   Respiratory: Negative for shortness of breath.    Cardiovascular: Negative for chest pain.   Gastrointestinal: Negative for diarrhea, nausea and vomiting.   All other systems reviewed and are negative.        Physical Exam   BP 130/85    Pulse 75    Temp 97.9 F (36.6 C) (Oral)    Resp 20    SpO2 98%   Physical Exam  Vitals and nursing note reviewed.   Constitutional:       General: She is not in acute distress.     Appearance: Normal appearance. She is well-developed.   HENT:      Head: Normocephalic and atraumatic.      Nose: Nose normal.      Mouth/Throat:      Mouth: Mucous membranes are moist.   Eyes:      Extraocular Movements: Extraocular movements intact.      Conjunctiva/sclera: Conjunctivae normal.   Cardiovascular:      Rate and Rhythm: Normal rate and regular rhythm.      Heart sounds: Normal heart sounds. No murmur heard.  No friction rub. No gallop.    Pulmonary:      Effort: Pulmonary effort is normal. No respiratory distress.      Breath sounds: Normal breath sounds. No wheezing or rales.   Abdominal:      General: Abdomen is flat. There is no  distension.      Palpations: Abdomen is soft. There is no mass.      Tenderness: There is abdominal tenderness. There is no guarding or rebound.      Comments: Well-healing early postoperative laparoscopic incisions and bruising, suprapubic tenderness to palpation.   Musculoskeletal:         General: No swelling, deformity or signs of injury. Normal range of motion.      Cervical back: Normal range of motion and neck supple.   Skin:     General: Skin is warm and dry.      Findings: No rash.   Neurological:      General: No focal deficit present.      Mental Status: She is alert. Mental status is at baseline.      Cranial Nerves: No cranial nerve deficit.   Psychiatric:         Mood and Affect: Mood normal.         Behavior: Behavior normal.           Diagnostic Study Results     Labs -     Results     Procedure Component Value Units Date/Time    UA Reflex to Micro - Reflex to Culture [[id number]]  (Abnormal) Collected: 11/13/20 2352     Updated: 11/14/20 0008     Urine Type Urine, Clean Ca     Color, UA Straw     Clarity, UA Clear     Specific Gravity UA 1.003     Urine pH 7.0     Leukocyte Esterase, UA Negative     Nitrite, UA Negative     Protein, UR Negative     Glucose, UA Negative     Ketones UA Negative     Urobilinogen, UA Normal mg/dL      Bilirubin, UA Negative     Blood, UA Small     RBC, UA 3 - 5 /hpf  WBC, UA 0 - 5 /hpf      Squamous Epithelial Cells, Urine 0 - 5 /hpf      Hyaline Casts, UA 0 - 3 /lpf           Radiologic Studies -   Radiology Results (24 Hour)     ** No results found for the last 24 hours. **      .    Medical Decision Making   I am the first provider for this patient.    I reviewed the vital signs, available nursing notes, past medical history, past surgical history, family history and social history.    Vital Signs-Reviewed the patient's vital signs.     Patient Vitals for the past 12 hrs:   BP Temp Pulse Resp   11/14/20 0041 130/85 -- 75 20   11/13/20 2315 127/89 97.9 F (36.6 C)  82 22         Medical Decision Making: Patient presents with symptoms consistent with urinary retention.  Bladder scan revealed high urine volume and Foley catheter was placed.  Patient immediately felt relief and urinalysis showed no UTI.  Patient will be discharged to follow-up with urology.          Diagnosis     Clinical Impression:   1. Urinary retention        Treatment Plan:   ED Disposition     ED Disposition Condition Date/Time Comment    Discharge  Thu Nov 14, 2020 12:31 AM Stacy Cummings discharge to home/self care.    Condition at disposition: Stable            _______________________________    Verlee Rossetti, MD, MD    _______________________________     Verlee Rossetti, MD  11/14/20 [phone]

## 2020-11-13 NOTE — Op Note (Signed)
Operative Note:  Date of Surgery: 11/13/20    Indications: This is a 49 y.o. year old patient who presents with gallstones.    Pre-operative Diagnosis: Calculus of gallbladder with other cholecystitis and obstruction    Post-operative Diagnosis: Same    Procedure: Laparoscopic Cholecystectomy; Intraoperative cholangiogram    Surgeon: Talbert Nan, MD     Assistants: Sander Nephew, who provided essential assistance throughout the entire procedure.    Anesthesia: General endotracheal anesthesia    Procedure Details   The ACS Risk Calculator was utilized to estimate the patient's chance of post-operative complications due to existing co-morbidities.  The results were presented to the patient in the pre-op holding area and discussed in detail.      The patient was seen again in the Holding Room. The risks, benefits, complications, treatment options/alternatives, and expected outcomes were discussed with the patient.  The patient concurred with the proposed plan, giving informed consent.    The patient was taken to Operating Room, placed supine onto the Operating table, identified as Stacy Cummings and the procedure verified as Laparoscopic, possible open, Cholecystectomy. A Time Out was held and the above information confirmed.    After confirmation of appropriate IV antibiotics and placement of BLE SCDs, patient was sedated and intubated by Anesthesia without any difficulties.  After the induction, the abdomen was prepped and draped in the usual sterile fashion.     We began by entering the abdomen just right of the umbilicus using a 5mm optical view trocar and then insufflated. The patient tolerated this well. We then placed our additional 5 mm ports in the  right upper quadrant and a 12mm port in the subxiphoid region. . We did a diagnostic laparoscopy which revealed no evidence of other findings. We then proceeded to elevate the gallbladder from its fundus and retract the liver upwards. We then retracted  the lateral border of the gallbladder down by the neck laterally. We grasped the infundibulum and retracted it laterally. This exposed the triangle. We then carefully and meticulously dissected with a mix of sharp and blunt dissection and hook electrocautery. First, we came out the lateral border of the gallbladder incising the peritoneal surface connecting to the liver. We then came medially and did the same. We identified both the cystic duct and the cystic artery. We obtained a clear critical view seeing 2 and only 2 structures going to the gallbladder and nothing else. We could see the liver through our critical view confirming that there were no other structures. The neck of the gallbladder was ligated and an opening made in the cystic duct.  A realtime C-arm fluoroscopic cholangiogram was obtained demonstrating normal anatomy, no filling defects, and good flow into the duodenum. The catheter was removed.  We then doubly clipped the stay side of both the cystic duct followed by the cystic artery. A single clip was placed on the specimen side. Both of these structures were incised sharply with laparoscopic scissors. We then used hook electrocautery to remove the gallbladder from the gallbladder fossa. We then removed the gallbladder completely. We placed it in an EndoCatch bag. We then irrigated the abdomen copiously with warm normal saline. There was no evidence of any other bile spillage and certainly no stones left in the gallbladder. We then inspected the gallbladder fossa. There was no evidence of bleeding.  We inspected the cystic duct and the cystic artery and the clips were intact. We then removed the gallbladder through the 12mm port site  and then we closed the fascia with our 0 Vicryl sutures.  A4-0 Monocryl in a subcuticular fashion was used for the skin. The 5 mm port sites were closed with interrupted 4-0 Monocryl in a subcuticular fashion. The wounds were dressed with Dermabond glue. The patient  tolerated the operation well.  The patient was extubated and taken to the PACU in stable condition. All sponge and instrument counts were correct x2. I, as the attending physician, certify that I was present and scrubbed for the entirety of the operation.      Findings:  Critical view. Negative cholangiogram    Estimated Blood Loss: Minimal           Drains: None                 Specimens: Gallbladder             Complications: None; patient tolerated the procedure well.           Disposition: PACU - hemodynamically stable.           Condition: stable      Talbert Nan, MD  [facility]  Office: [phone]

## 2020-11-13 NOTE — Transfer of Care (Signed)
Anesthesia Transfer of Care Note    Patient: Stacy Cummings    Procedures performed: Procedure(s):  LAPAROSCOPIC, CHOLECYSTECTOMY, CHOLANGIOGRAM    Anesthesia type: General ETT    Patient location:Phase I PACU    Last vitals:   Vitals:    11/13/20 1311   BP: 152/73   Pulse: 80   Resp: 13   Temp: 36.7 C (98.1 F)   SpO2: 100%       Post pain: Patient not complaining of pain, continue current therapy      Mental Status:awake    Respiratory Function: tolerating face mask    Cardiovascular: stable    Nausea/Vomiting: patient not complaining of nausea or vomiting    Hydration Status: adequate    Post assessment: no apparent anesthetic complications    Signed by: Colin Mulders, CRNA  11/13/20 1:18 PM

## 2020-11-13 NOTE — Progress Notes (Addendum)
Assumed care of patient.

## 2020-11-13 NOTE — ED Triage Notes (Signed)
Had lapcholy today at [facility], Dr Eustace Quail. Unable to empty bladder.

## 2020-11-13 NOTE — Anesthesia Preprocedure Evaluation (Signed)
Anesthesia Evaluation    AIRWAY    Mallampati: II    TM distance: >3 FB  Neck ROM: full  Mouth Opening:full  Planned to use difficult airway equipment: No CARDIOVASCULAR    cardiovascular exam normal and regular       DENTAL    no notable dental hx     PULMONARY    pulmonary exam normal     OTHER FINDINGS                  ===============================================================    ADDITIONAL MEDICAL HISTORY      No LMP recorded. Patient has had a hysterectomy.    Body mass index is 29.12 kg/m.    Stop-Bang Score: 0    Date of last liquid: 11/13/20  Time of last liquid: 0820 (meds with sips of water)   Date of last solid: 11/13/20  Time of last solid: 0000       Scheduled  Procedure(s) with comments:  LAPAROSCOPIC, CHOLECYSTECTOMY, CHOLANGIOGRAM - ASST: Y  Attending Physician: Rick Duff,*  Admission Date:11/11/2020      Assessment:      Patient Active Problem List   Diagnosis    Choledocholithiasis    Symptomatic cholelithiasis          Past Medical History:   Diagnosis Date    Migraine             Past Surgical History:   Procedure Laterality Date    BLADDER REPAIR      FOOT MASS EXCISION Right 2019    calcified bone or tendon per patient    HYSTERECTOMY         Social History     Social History     Tobacco Use    Smoking status: Never Smoker    Smokeless tobacco: Never Used   Substance Use Topics    Alcohol use: Yes     Alcohol/week: 2.0 standard drinks     Types: 2 Glasses of wine per week      Allergies:     Allergies   Allergen Reactions    Decadron [Dexamethasone] Other (See Comments)     Esophageal acid reflux    Pseudoephedrine Palpitations     Hospital Medications:     Current Facility-Administered Medications   Medication Dose Route Frequency    [MAR Hold] DULoxetine  60 mg Oral Daily    [MAR Hold] estradiol  0.5 mg Oral BID Meals    [MAR Hold] piperacillin-tazobactam  4.5 g Intravenous Q8H    [MAR Hold] traZODone  62.5 mg Oral QHS     Home Medications:     Medications  Prior to Admission   Medication Sig Dispense Refill Last Dose    DULoxetine (CYMBALTA) 60 MG capsule Take 60 mg by mouth daily   11/11/2020    eletriptan (RELPAX) 40 MG tablet Take 40 mg by mouth as needed for Migraine may repeat in 2 hours if necessary   Past Week    estradiol (ESTRACE) 1 MG tablet Take 0.5 mg by mouth 2 (two) times daily with meals   11/11/2020    Galcanezumab-gnlm (EMGALITY, 300 MG DOSE, SC) Inject into the skin every 30 (thirty) days   Past Month    OnabotulinumtoxinA (BOTOX IJ) Inject as directed every 3 (three) months   Past Month    Rimegepant Sulfate (Nurtec) 75 MG Tablet Dispersible Take by mouth   Past Month at Unknown time    traZODone (DESYREL) 50 MG tablet  Take 60 mg by mouth nightly   11/11/2020    B Complex Vitamins (B COMPLEX 1 PO) Take by mouth   11/11/2020 at am    Diclofenac Potassium 25 MG Cap Take 25 mg by mouth   More than a month at Unknown time    Multiple Vitamins-Minerals (Super Thera Vite M) Tab Take 1 tablet by mouth daily   11/11/2020 at am    Probiotic Product (Acidophilus/Goat Milk) Cap Take 1 capsule by mouth daily   11/11/2020 at am    SUMAtriptan Succinate 3 MG/0.5ML Solution Auto-injector INJECT ONE UNDER SKIN EVERY TWO HOURS AS NEEDED. LIMIT FOUR PER DAY   over 1 year           Vitals:     Vitals:    11/13/20 0821   BP: 125/81   Pulse: 77   Resp: 19   Temp: 36.8 C (98.2 F)   SpO2: 98%       Labs:     Results     ** No results found for the last 24 hours. **        Recent Labs   Lab 11/11/20  1836   WBC 10.20*   Hgb 13.3   Hematocrit 40.2   Platelets 356*        Lab Results   Component Value Date    HGB 13.3 11/11/2020    PLT 356 (H) 11/11/2020       Lab Results   Component Value Date    NA 141 11/12/2020    CL 107 11/12/2020    CO2 28 11/12/2020    K 3.9 11/12/2020    CREAT 0.8 11/12/2020    BUN 10 11/12/2020    GLU 91 11/12/2020       Lab Results   Component Value Date    AST 432 (H) 11/12/2020    ALT 397 (H) 11/12/2020       No results found for:  PT, PTT, INR    Lab Results   Component Value Date    COVID Nasopharyngeal 11/12/2020    COVID Negative 11/12/2020       Rads:     Radiology Results (24 Hour)     Procedure Component Value Units Date/Time    MRI Abdomen W WO Contrast MRCP [[id number]] Collected: 11/12/20 1301    Order Status: Completed Updated: 11/12/20 1308    Narrative:      History: Pain. Elevated liver function tests. Evaluate for  choledocholithiasis.    COMPARISON: CT scan and ultrasound exams 11/11/2020.    TECHNIQUE: Multiplanar MR imaging was performed. Images included  enhanced sequences following 8 cc IV Gadavist. MRCP also performed.    FINDINGS:  The gallbladder is distended and filled with innumerable small layering  calculi. There is no appreciable gallbladder wall thickening. There is  trace pericholecystic fluid.    MRCP reveals normal caliber common bile duct and intrahepatic bile  ducts. There is no detectable choledocholithiasis. Mid CBD diameter  measures 6 mm.    The spleen is normal size. Pancreas is normal with no detectable  pancreatitis. Kidneys and adrenal glands are normal. There is no  hydronephrosis. There is no localizing lymphadenopathy or ascites. 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 There is no ascites.      Impression:        1. Distended gallbladder filled with innumerable small layering calculi.  2. Trace  pericholecystic fluid; no appreciable gallbladder wall  thickening.  3. Normal caliber bile ducts with no detectable choledocholithiasis.  4. Remainder as above.    Wilmon Pali, MD   11/12/2020 1:06 PM                      Relevant Problems   No relevant active problems               Anesthesia Plan    ASA 2     general               (Risks discussed including but not limited to:    - Neurological complications such as stroke, TIA  - Cardiovascular complications such as heart attack, Arrhythmias, Cardiac arrest   - Pulmonary  complications such as Asthmatic attack,Pulm. Aspiration, Bronchospasm and Pneumonia  - Intra-operative awareness,  - Dental Injuries  - Sore Throat.  - Allergic reactions.  - Death.     Anesthesia explained and Questions answered.     Pt understands and wishes to proceed.    Kathleen Argue, MD, MD)      intravenous induction   Detailed anesthesia plan: general endotracheal        Post op pain management: per surgeon    informed consent obtained    Plan discussed with CRNA and attending.    ECG reviewed  pertinent labs reviewed             Signed by: Kathleen Argue, MD 11/13/20 10:09 AM

## 2020-11-13 NOTE — Anesthesia Postprocedure Evaluation (Signed)
Anesthesia Post Evaluation    Patient: Stacy Cummings    Procedure(s):  LAPAROSCOPIC, CHOLECYSTECTOMY, CHOLANGIOGRAM    Anesthesia type: general    Last Vitals:   Vitals Value Taken Time   BP 156/86 11/13/20 1400   Temp 36.7 C (98.1 F) 11/13/20 1311   Pulse 62 11/13/20 1400   Resp 14 11/13/20 1400   SpO2 96 % 11/13/20 1400                 Anesthesia Post Evaluation:     Patient Evaluated: PACU  Patient Participation: complete - patient participated  Level of Consciousness: awake    Pain Management: adequate    Airway Patency: patent    Anesthetic complications: No      PONV Status: none    Cardiovascular status: acceptable  Respiratory status: acceptable  Hydration status: acceptable        Signed by: Caro Laroche, MD, 11/13/2020 2:08 PM

## 2020-11-13 NOTE — Discharge Instr - AVS First Page (Addendum)
Admit Date: 11/11/2020   Discharge Date:11/13/2020      Attending: Rick Duff,*      Procedure(s) (LRB):  LAPAROSCOPIC, CHOLECYSTECTOMY, CHOLANGIOGRAM (N/A)    Admission Diagnoses:   Choledocholithiasis [K80.50]  Generalized abdominal pain [R10.84]  Calculus of gallbladder with biliary obstruction but without cholecystitis [K80.21]      POSTOP INSTRUCTIONS--GALLBLADDER SURGERY    Patient Instructions after Gallbladder Surgery.  Please refer to the complete postop instructions for details.    WHAT TO EXPECT AT YOUR SURGICAL SITES:    Pink/reddish drainage, bruising, swelling/lump at incisions may occur and is normal.    CARE FOR THE INCISION:    Skin glue over your incisions will dissolve over the next two weeks.  If you have white tapes on the skin (steri-strips), these will fall off over 7-10 days  Shower 24 hours after your surgery.  No tub bathing, or pool/ocean for 1 week.    DIET:    Keep up with your liquids.  If your appetite has returned, eat what your system can tolerate.  We suggest a low fat diet.    ACTIVITY:    If it hurts, don't do it!  Walking & stairs are encouraged.    Avoid strenuous physical activity & lifting until cleared by your surgeon.  You may drive again once you have stopped taking prescription medication for pain.      MEDICATION:    Resume all home medications.  Use over-the-counter pain medications or a prescribed narcotic for your discomfort as needed.    For constipation, an over-the-counter stool softener or laxative may be taken.  Please refer to attached detailed instructions or our website for this issue.        WHAT TO LOOK FOR:    Please call our office immediately at [phone] or go to the ER if you develop any of the following:    Excessive drainage, bleeding, redness or swelling at or around the incisions  Fever over 101F  Increased abdominal pain  Persistent nausea or vomiting  Difficulty with urination  Difficulty breathing, chest pain or calf  pain  Brown/dark yellow urine, or yellow discoloration of the skin or eyes    FOLLOW-UP:    We will see you in our office 1-2 weeks after your surgery.  If not already scheduled, please call [phone] to arrange your post op visit.      Please refer to the attached detailed instructions for any questions or for clarification.

## 2020-11-14 LAB — URINALYSIS REFLEX TO MICROSCOPIC EXAM - REFLEX TO CULTURE
Bilirubin, UA: NEGATIVE
Glucose, UA: NEGATIVE
Ketones UA: NEGATIVE
Leukocyte Esterase, UA: NEGATIVE
Nitrite, UA: NEGATIVE
Protein, UR: NEGATIVE
Specific Gravity UA: 1.003 (ref 1.001–1.035)
Urine pH: 7 (ref 5.0–8.0)
Urobilinogen, UA: NORMAL mg/dL (ref 0.2–2.0)

## 2020-11-14 NOTE — Discharge Instructions (Signed)
Foley Catheter Care    You have had a Foley catheter placed to drain your urine (pee).    This is most often needed when there is a blockage at the base of the bladder, in the prostate or in the urethra (the tube that carries urine from the bladder to the outside). Usually the tube is only needed for a few days.    The catheter is attached to a bag. You will need to change the bag when it gets full. There are two types of bags: a leg bag, which is easier to wear under clothes, and a larger bag, which may be easier to use at night. The nurse will show you how to properly change the bag.    The catheter is put into the bladder under sterile conditions. However, an infection can still develop. The catheter can also get obstructed (blocked).    Infection is the most common problem with Foley catheters. Some signs & symptoms of infection are:   Fever (temperature higher than 100.4F / 38C), malaise (a feeling of unwellness) and chills. Nausea (feeling sick to the stomach) and vomiting (throwing up) are also symptoms. There may be burning of the urethra, penis/vagina or pubic area.   Urine leaking from around the catheter.   Increased spasms of the legs, abdomen (belly) or bladder. Mild, infrequent spasms are normal.   Sediment (cloudy deposits) or mucus in the urine. There may be cloudy urine, bloody (pink or red) urine or urine that smells very bad.    Use the instructions below for daily Foley catheter care. Doing so will help reduce the risk of infection:   Wash your hands with soap and water before and after doing any catheter care.   Clean the skin around the catheter insertion site with soap and water. Use a warm washcloth. Gently hold onto the catheter while washing. Remove any crust or debris (waste) on the skin and catheter.   Always keep the catheter bag below the level of the bladder. This keeps urine in the bag from going back into your bladder. This will help to prevent an infection   Drink  plenty of fluids. Drink a few glasses of water a day unless your doctor has limited the amount of fluids you can have. If you are thirsty, you haven't had enough to drink today!   Empty the urine bag when it is about 2/3 full. Wash your hands with soap and water before you empty the bag. Drain the bag right into the toilet or a container on the floor. Take off the tip cover and open the slide valve. Let the urine flow out. Do not touch the end of the drain spout.    YOU SHOULD SEEK MEDICAL ATTENTION IMMEDIATELY, EITHER HERE OR AT THE NEAREST EMERGENCY DEPARTMENT, IF ANY OF THE FOLLOWING OCCURS:   Fever (temperature higher than 100.4F / 38C), flank (side) pain, nausea (sick to your stomach) or vomiting (throwing up), shaking chills.   Repeated feelings of bladder blockage with no urine coming out, low abdominal (belly) pain or leaking around the tube. This may mean the catheter is blocked or is not draining properly.   Any other worsening symptoms or any other concerns.   The urine is cloudy and smells bad.   Urine leaks around the catheter or the catheter falls out.

## 2020-11-15 ENCOUNTER — Encounter: Payer: Self-pay | Admitting: Surgery

## 2020-11-18 LAB — LAB USE ONLY - HISTORICAL SURGICAL PATHOLOGY

## 2020-11-20 ENCOUNTER — Telehealth: Payer: Self-pay | Admitting: *Deleted

## 2020-11-20 NOTE — Telephone Encounter (Signed)
Noted! Thank you

## 2020-11-20 NOTE — Telephone Encounter (Signed)
Dr.Armbruster,  This patient is scheduled with you on 12/18/2020 for a direct screening colonoscopy. She had a laparoscopic cholecystectomy on 11/13/2020. Ok to proceed with the colonoscopy on 12/18/2020? Please advise. Thank you, Ramonica Grigg pv

## 2020-11-20 NOTE — Telephone Encounter (Signed)
If she is otherwise feeling okay and recovered, okay to proceed with colonoscopy as scheduled

## 2020-11-20 NOTE — Discharge Summary (Signed)
@NOTESDEPTLOGO @     Discharge Summary    Date:11/20/2020   Patient Name: Stacy Cummings  Attending Physician: No att. providers found    Date of Admission:   11/11/2020    Date of Discharge:    11/20/2020    Admitting Diagnosis:   Choledocholithiasis [K80.50]  Generalized abdominal pain [R10.84]  Calculus of gallbladder with biliary obstruction but without cholecystitis [K80.21]      Problems:   Problem Lists:  Patient Active Problem List   Diagnosis    Choledocholithiasis    Symptomatic cholelithiasis         Procedure(s):  LAPAROSCOPIC, CHOLECYSTECTOMY, CHOLANGIOGRAM    7 Days Post-Op  -------------------         Discharge Dx:   Acute cholecystitis    Treatment Team:   Treatment Team:   Consulting Physician: Leatha Gilding, MD     Procedures performed:   CT Abd/Pelvis with IV Contrast only    Result Date: 11/11/2020  1.  Suggestion of pericholecystic edema with mild gallbladder distention. Recommend further evaluation with right upper quadrant ultrasound to exclude acute cholecystitis. 2.  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 Theador Hawthorne, MD  11/11/2020 7:43 PM    Fluoroscopy less than 1 hour    Result Date: 11/13/2020  Fluoroscopic guidance provided. Kristine Linea, MD  11/13/2020 12:55 PM    US Abdomen Limited RUQ    Result Date: 11/11/2020   Cholelithiasis with pericholecystic edema, without gallbladder wall thickening. The common bile duct is dilated up to 6 mm. Recommend further evaluation with MRI to evaluate for choledocholithiasis. Theador Hawthorne, MD  11/11/2020 9:56 PM    MRI Abdomen W WO Contrast MRCP    Result Date: 11/12/2020  1. Distended gallbladder filled with innumerable small layering calculi. 2. Trace pericholecystic fluid; no appreciable gallbladder wall thickening. 3. Normal caliber bile ducts with no detectable choledocholithiasis. 4. Remainder as above. Wilmon Pali, MD  11/12/2020 1:06 PM    Abdomen Portable    Result Date: 11/11/2020   Nonspecific  nonobstructive bowel gas pattern. Theador Hawthorne, MD  11/11/2020 7:21 PM      Procedure(s):  LAPAROSCOPIC, CHOLECYSTECTOMY, CHOLANGIOGRAM      Reason for Admission:     Cholecystitis    Hospital Course:     The patient presented for cholecystitis. They underwent laparoscopic cholecystectomy. The operation was uncomplicated and they tolerated it well. Afterwards their pain was controlled and they were able to tolerate a diet. They had return of bowel function. They were discharged home.      Condition at Discharge:     Stable      Today:     BP 122/69    Pulse 75    Temp 98.1 F (36.7 C)    Resp 15    Ht 1.651 m (5\' 5" )    Wt 79.4 kg (175 lb)    SpO2 96%    BMI 29.12 kg/m   Ranges for the last 24 hours:       Last set of labs   Results     Procedure Component Value Units Date/Time    Bilirubin (Fractionated), Total & Direct [[id number]]  (Abnormal) Collected: 11/12/20 0457    Specimen: Blood Updated: 11/12/20 0545     Bilirubin Direct 1.1 mg/dL      Bilirubin Indirect 0.5 mg/dL     GFR [[id number]] Collected: 11/12/20 0457     Updated: 11/12/20 0545     EGFR >60.0    Comprehensive  metabolic panel [[id number]]  (Abnormal) Collected: 11/12/20 0457    Specimen: Blood Updated: 11/12/20 0545     Glucose 91 mg/dL      BUN 10 mg/dL      Creatinine 0.8 mg/dL      Sodium 308 mEq/L      Potassium 3.9 mEq/L      Chloride 107 mEq/L      CO2 28 mEq/L      Calcium 8.9 mg/dL      Protein, Total 5.9 g/dL      Albumin 3.4 g/dL      AST (SGOT) 657 U/L      ALT 397 U/L      Alkaline Phosphatase 100 U/L      Bilirubin, Total 1.6 mg/dL      Globulin 2.5 g/dL      Albumin/Globulin Ratio 1.4     Anion Gap 6.0    COVID-19 (SARS-COV-2) ([facility]) [[id number]] Collected: 11/12/20 0036    Specimen: Nasopharyngeal Swab from Nasopharynx Updated: 11/12/20 0103     Purpose of COVID testing Screening     SARS-CoV-2 Specimen Source Nasopharyngeal     SARS CoV 2 Overall Result Negative    Narrative:      o Collect and clearly label specimen type:  o  Upper respiratory specimen: One Nasopharyngeal Dry Swab NO  Transport Media.  o Hand deliver to laboratory ASAP  Indication for testing->Extended care facility admission to  semi private room  Screening    Comprehensive metabolic panel [[id number]]  (Abnormal) Collected: 11/11/20 1836    Specimen: Blood Updated: 11/11/20 1901     Glucose 142 mg/dL      BUN 18 mg/dL      Creatinine 0.8 mg/dL      Sodium 027 mEq/L      Potassium 4.0 mEq/L      Chloride 101 mEq/L      CO2 28 mEq/L      Calcium 10.2 mg/dL      Protein, Total 7.3 g/dL      Albumin 4.2 g/dL      AST (SGOT) 253 U/L      ALT 242 U/L      Alkaline Phosphatase 109 U/L      Bilirubin, Total 1.5 mg/dL      Globulin 3.1 g/dL      Albumin/Globulin Ratio 1.4     Anion Gap 9.0    Lipase [[id number]] Collected: 11/11/20 1836    Specimen: Blood Updated: 11/11/20 1901     Lipase 28 U/L     GFR [[id number]] Collected: 11/11/20 1836     Updated: 11/11/20 1901     EGFR >60.0    Urinalysis Reflex to Microscopic Exam- Reflex to Culture [[id number]]  (Abnormal) Collected: 11/11/20 1836     Updated: 11/11/20 1847     Urine Type Urine, Clean Ca     Color, UA Amber     Clarity, UA Cloudy     Specific Gravity UA 1.016     Urine pH 7.0     Leukocyte Esterase, UA Negative     Nitrite, UA Negative     Protein, UR Negative     Glucose, UA Negative     Ketones UA Negative     Urobilinogen, UA 4.0 mg/dL      Bilirubin, UA Negative     Blood, UA Negative    CBC and differential [[id number]]  (Abnormal) Collected: 11/11/20 1836    Specimen: Blood Updated: 11/11/20 1844  WBC 10.20 x10 3/uL      Hgb 13.3 g/dL      Hematocrit 16.1 %      Platelets 356 x10 3/uL      RBC 4.11 x10 6/uL      MCV 97.8 fL      MCH 32.4 pg      MCHC 33.1 g/dL      RDW 12 %      MPV 8.9 fL      Neutrophils 83.6 %      Lymphocytes Automated 9.8 %      Monocytes 5.7 %      Eosinophils Automated 0.2 %      Basophils Automated 0.4 %      Immature Granulocytes 0.3 %      Nucleated RBC 0.0 /100 WBC      Neutrophils  Absolute 8.53 x10 3/uL      Lymphocytes Absolute Automated 1.00 x10 3/uL      Monocytes Absolute Automated 0.58 x10 3/uL      Eosinophils Absolute Automated 0.02 x10 3/uL      Basophils Absolute Automated 0.04 x10 3/uL      Immature Granulocytes Absolute 0.03 x10 3/uL      Absolute NRBC 0.00 x10 3/uL             Micro / Labs / Path pending:     Unresulted Labs     None            Discharge Medications:     Discharge Medication List as of 11/13/2020  2:10 PM      START taking these medications    Details   oxyCODONE (Roxicodone) 5 MG immediate release tablet Take 1 tablet (5 mg total) by mouth every 4 (four) hours as needed for Pain, Starting Wed 11/13/2020, Until Sat 11/16/2020 at 2359, E-Rx         CONTINUE these medications which have NOT CHANGED    Details   B Complex Vitamins (B COMPLEX 1 PO) Take by mouth, Historical Med      Diclofenac Potassium 25 MG Cap Take 25 mg by mouth, Historical Med      DULoxetine (CYMBALTA) 60 MG capsule Take 60 mg by mouth daily, Historical Med      eletriptan (RELPAX) 40 MG tablet Take 40 mg by mouth as needed for Migraine may repeat in 2 hours if necessary, Historical Med      estradiol (ESTRACE) 1 MG tablet Take 0.5 mg by mouth 2 (two) times daily with meals, Historical Med      Galcanezumab-gnlm (EMGALITY, 300 MG DOSE, SC) Inject into the skin every 30 (thirty) days, Historical Med      Multiple Vitamins-Minerals (Super Thera Vite M) Tab Take 1 tablet by mouth daily, Historical Med      OnabotulinumtoxinA (BOTOX IJ) Inject as directed every 3 (three) months, Historical Med      Probiotic Product (Acidophilus/Goat Milk) Cap Take 1 capsule by mouth daily, Historical Med      Rimegepant Sulfate (Nurtec) 75 MG Tablet Dispersible Take by mouth, Historical Med      SUMAtriptan Succinate 3 MG/0.5ML Solution Auto-injector INJECT ONE UNDER SKIN EVERY TWO HOURS AS NEEDED. LIMIT FOUR PER DAY, Historical Med      traZODone (DESYREL) 50 MG tablet Take 60 mg by mouth nightly, Historical Med                Discharge Instructions:        Follow-up Information  Talbert Nan, MD. Schedule an appointment as soon as possible for a visit in 2 weeks.    Specialty: Surgery  Contact information:  [address]  [address] [place] [address]  [phone]             Pcp, None, MD .                       Discharge Diet: Low fiber diet    Wound 11/13/20 Surgical Incision Abdomen Other (Comment) DERMABOND (Active)   Date First Assessed/Time First Assessed: 11/13/20 1217   Wound Type: Surgical Incision  Location: Abdomen  Wound Location Orientation: Other (Comment)  Wound Description (Comments): DERMABOND      Assessments 11/13/2020  8:21 AM 11/13/2020  1:33 PM   Site Description Clean;Dry;Intact Dressing covering site (UTA);Other (Comment)   Peri-wound Description Clean;Dry;Intact --   Drainage Amount -- None   Dressing Skin Adhesive Other (Comment)   Dressing Status Clean;Dry;Intact Clean;Dry;Intact       No Linked orders to display          Disposition:  Home or Self Care         Patient Instructions:    Activity:  We recommend quiet activity initially.  You may walk and go up and down stairs.  Your level of discomfort will guide the amount of activity you can do.  You should avoid strenuous activity and heavy lifting greater than 20 lbs for 4 to 6 weeks.  Once you have stopped taking prescription medication and can walk without difficulty, you may drive again.    Care for the incision: If, upon discharge, your wound is well-sealed and without drainage, you may shower.  Leave the white steri-strips on the incision for at least one-week.  The steri-strips may get wet.  No soaking your abdomen for two weeks, including baths, pools, the ocean, or hot tubs.  Gently pat the incision dry after showering.  The wound does not need gauze dressing, unless you wish to apply one.    If there is some draining, or if the wound has been left open, you will need a gauze dressing over it as it heals.      Diet:   Initially your diet may be decreased; this is normal.  We encourage you to keep up with liquids.  Begin with a light diet and avoid hard to digest foods such as broccoli, cauliflower, fatty foods, and spicy foods, as well as foods with seeds, nuts and popcorn.    Medications:  Resume all home medications as per the medication reconciliation form.  You will be given a prescription for pain medication. Take this as directed for post-operative pain.  If you are experiencing only mild discomfort, you may find over-the-counter medications, such as Tylenol (acetaminophen) or Advil/Nuprin (Ibuprofen), may be all you need for comfort.  Take stool softeners such as colace, metamucil, or sennokot while taking pain medicine to avoid constipation.    What to look for:  Frequent bowel movements or diarrhea is common.  Generally this will improve over 2 to 3 weeks after surgery.  Call our office if you do not have a bowel movement in 48 hours despite use of stool softeners or if there is blood in your stool.  You may notice a slight drainage or redness around sutures or clips at the incision.  This is normal and not a cause for concern.  However, please call our office immediately  if you develop any of the following:    -- Increased abdominal pain  -- Excessive drainage, redness or swelling at or around the incision  -- Fever over 101F  -- Persistent nausea or vomiting  -- Difficulty with urination  -- Difficulty breathing, chest pain or calf pain   -- Constipation which does not improve over 72 hours despite intervention    Follow-up:  You will be seen in our office 7 to 10 days after your surgery.  Prior to surgery, you should have made an appointment for your  post-operative visit.  If for some reason that appointment was not scheduled, please call our office at [phone] as soon as your return home to schedule your appointment.    Difficulties:  Please call us if any problems or questions arise.  We can be reached any  time, including evenings and weekends, by calling our office number [phone].      Signed by: Talbert Nan, MD      [facility]  [phone]

## 2020-12-04 ENCOUNTER — Ambulatory Visit (AMBULATORY_SURGERY_CENTER): Payer: Self-pay | Admitting: *Deleted

## 2020-12-04 ENCOUNTER — Other Ambulatory Visit: Payer: Self-pay

## 2020-12-04 VITALS — Ht 62.75 in | Wt 172.0 lb

## 2020-12-04 DIAGNOSIS — Z1211 Encounter for screening for malignant neoplasm of colon: Secondary | ICD-10-CM

## 2020-12-04 MED ORDER — SUTAB 1479-225-188 MG PO TABS: 1.0000 | ORAL_TABLET | ORAL | 0 refills | Status: DC

## 2020-12-04 NOTE — Progress Notes (Signed)
Patient is here in-person for PV. Patient denies any allergies to eggs or soy. Patient denies any problems with anesthesia/sedation. Patient denies any oxygen use at home. Patient denies taking any diet/weight loss medications or blood thinners. Patient is not being treated for MRSA or C-diff. Patient is aware of our care-partner policy and [id number]-[id number] safety protocol.  Patient states she is feeling good from her Lap chol 11/13/20 and her PCP said she was ok to have the colon at this time.  COVID-19 vaccines completed on 04/03/2020 x2, per patient.   Pt request Sutab-Prep Prescription coupon given to the patient. Patient aware of cost.

## 2020-12-06 ENCOUNTER — Encounter: Payer: Self-pay | Admitting: Gastroenterology

## 2020-12-18 ENCOUNTER — Other Ambulatory Visit: Payer: Self-pay

## 2020-12-18 ENCOUNTER — Encounter: Payer: Self-pay | Admitting: Gastroenterology

## 2020-12-18 ENCOUNTER — Ambulatory Visit (AMBULATORY_SURGERY_CENTER): Payer: 59 | Admitting: Gastroenterology

## 2020-12-18 VITALS — BP 124/79 | HR 57 | Temp 98.4°F | Resp 15 | Ht 62.0 in | Wt 172.0 lb

## 2020-12-18 DIAGNOSIS — D127 Benign neoplasm of rectosigmoid junction: Secondary | ICD-10-CM

## 2020-12-18 DIAGNOSIS — K635 Polyp of colon: Secondary | ICD-10-CM

## 2020-12-18 DIAGNOSIS — Z1211 Encounter for screening for malignant neoplasm of colon: Secondary | ICD-10-CM

## 2020-12-18 MED ORDER — SODIUM CHLORIDE 0.9 % IV SOLN: 500.0000 mL | Freq: Once | INTRAVENOUS | Status: DC

## 2020-12-18 NOTE — Progress Notes (Signed)
Called to room to assist during endoscopic procedure.  Patient ID and intended procedure confirmed with present staff. Received instructions for my participation in the procedure from the performing physician.  

## 2020-12-18 NOTE — Progress Notes (Signed)
PT taken to PACU. Monitors in place. VSS. Report given to RN. 

## 2020-12-18 NOTE — Progress Notes (Signed)
A.G. vital signs. 

## 2020-12-18 NOTE — Progress Notes (Signed)
Pt's states no medical or surgical changes since previsit or office visit. 

## 2020-12-18 NOTE — Patient Instructions (Signed)
HANDOUTS PROVIDED ON: polyps, diverticulosis  The polyps removed today have been sent for pathology.  The results can take 1-3 weeks to receive.  When your next colonoscopy should occur will be based on the pathology results.    You may resume your previous diet and medication schedule.  Thank you for allowing [place] to care for you today!!!   YOU HAD AN ENDOSCOPIC PROCEDURE TODAY AT THE [facility]:   Refer to the procedure report that was given to you for any specific questions about what was found during the examination.  If the procedure report does not answer your questions, please call your gastroenterologist to clarify.  If you requested that your care partner not be given the details of your procedure findings, then the procedure report has been included in a sealed envelope for you to review at your convenience later.  YOU SHOULD EXPECT: Some feelings of bloating in the abdomen. Passage of more gas than usual.  Walking can help get rid of the air that was put into your GI tract during the procedure and reduce the bloating. If you had a lower endoscopy (such as a colonoscopy or flexible sigmoidoscopy) you may notice spotting of blood in your stool or on the toilet paper. If you underwent a bowel prep for your procedure, you may not have a normal bowel movement for a few days.  Please Note:  You might notice some irritation and congestion in your nose or some drainage.  This is from the oxygen used during your procedure.  There is no need for concern and it should clear up in a day or so.  SYMPTOMS TO REPORT IMMEDIATELY:   Following lower endoscopy (colonoscopy or flexible sigmoidoscopy):  Excessive amounts of blood in the stool  Significant tenderness or worsening of abdominal pains  Swelling of the abdomen that is new, acute  Fever of 100F or higher   For urgent or emergent issues, a gastroenterologist can be reached at any hour by calling [phone]. Do not use  MyChart messaging for urgent concerns.    DIET:  We do recommend a small meal at first, but then you may proceed to your regular diet.  Drink plenty of fluids but you should avoid alcoholic beverages for 24 hours.  ACTIVITY:  You should plan to take it easy for the rest of today and you should NOT DRIVE or use heavy machinery until tomorrow (because of the sedation medicines used during the test).    FOLLOW UP: Our staff will call the number listed on your records 48-72 hours following your procedure to check on you and address any questions or concerns that you may have regarding the information given to you following your procedure. If we do not reach you, we will leave a message.  We will attempt to reach you two times.  During this call, we will ask if you have developed any symptoms of COVID 19. If you develop any symptoms (ie: fever, flu-like symptoms, shortness of breath, cough etc.) before then, please call [phone].  If you test positive for Covid 19 in the 2 weeks post procedure, please call and report this information to [place].    If any biopsies were taken you will be contacted by phone or by letter within the next 1-3 weeks.  Please call us at [phone] if you have not heard about the biopsies in 3 weeks.    SIGNATURES/CONFIDENTIALITY: You and/or your care partner have signed paperwork which will be  entered into your electronic medical record.  These signatures attest to the fact that that the information above on your After Visit Summary has been reviewed and is understood.  Full responsibility of the confidentiality of this discharge information lies with you and/or your care-partner. 

## 2020-12-18 NOTE — Op Note (Signed)
[facility] Patient Name: Anna Thomas Procedure Date: 12/18/2020 9:28 AM MRN: [id number] Endoscopist: Remo Lipps P. Havery Moros , MD Age: 49 Referring MD:  Date of Birth: [birth year] Gender: Female Account #: [id number] Procedure:                Colonoscopy Indications:              Screening for colorectal malignant neoplasm, This                            is the patient's first colonoscopy Medicines:                Monitored Anesthesia Care Procedure:                Pre-Anesthesia Assessment:                           - Prior to the procedure, a History and Physical                            was performed, and patient medications and                            allergies were reviewed. The patient's tolerance of                            previous anesthesia was also reviewed. The risks                            and benefits of the procedure and the sedation                            options and risks were discussed with the patient.                            All questions were answered, and informed consent                            was obtained. Prior Anticoagulants: The patient has                            taken no previous anticoagulant or antiplatelet                            agents. ASA Grade Assessment: II - A patient with                            mild systemic disease. After reviewing the risks                            and benefits, the patient was deemed in                            satisfactory condition to undergo the procedure.  After obtaining informed consent, the colonoscope                            was passed under direct vision. Throughout the                            procedure, the patient's blood pressure, pulse, and                            oxygen saturations were monitored continuously. The                            Olympus CF-HQ190L [id number]) Colonoscope was                            introduced through the  anus and advanced to the the                            cecum, identified by appendiceal orifice and                            ileocecal valve. The colonoscopy was performed                            without difficulty. The patient tolerated the                            procedure well. The quality of the bowel                            preparation was adequate. The ileocecal valve,                            appendiceal orifice, and rectum were photographed. Scope In: 9:41:32 AM Scope Out: 10:10:26 AM Scope Withdrawal Time: 0 hours 22 minutes 14 seconds  Total Procedure Duration: 0 hours 28 minutes 54 seconds  Findings:                 The perianal and digital rectal examinations were                            normal.                           The colon was extremely tortuous which prolonged                            both intubation and withdrawal.                           A few small-mouthed diverticula were found in the                            ascending colon.  A 3 mm polyp was found in the recto-sigmoid colon.                            The polyp was sessile. The polyp was removed with a                            cold snare. Resection and retrieval were complete.                           The exam was otherwise without abnormality. Complications:            No immediate complications. Estimated blood loss:                            Minimal. Estimated Blood Loss:     Estimated blood loss was minimal. Impression:               - Extremely tortuous colon.                           - Diverticulosis in the ascending colon.                           - One 3 mm polyp at the recto-sigmoid colon,                            removed with a cold snare. Resected and retrieved.                           - The examination was otherwise normal. Recommendation:           - Patient has a contact number available for                            emergencies. The  signs and symptoms of potential                            delayed complications were discussed with the                            patient. Return to normal activities tomorrow.                            Written discharge instructions were provided to the                            patient.                           - Resume previous diet.                           - Continue present medications.                           - Await pathology results. Viviann Spare P. Adela Lank, MD  12/18/2020 10:14:59 AM This report has been signed electronically.

## 2020-12-23 ENCOUNTER — Telehealth: Payer: Self-pay | Admitting: *Deleted

## 2020-12-23 NOTE — Telephone Encounter (Signed)
  Follow up Call-  Call back number 12/18/2020  Post procedure Call Back phone  # [phone]  Permission to leave phone message Yes  Some recent data might be hidden    [facility]

## 2020-12-23 NOTE — Telephone Encounter (Signed)
  Follow up Call-  Call back number 12/18/2020  Post procedure Call Back phone  # [phone]  Permission to leave phone message Yes  Some recent data might be hidden     Patient questions:  Do you have a fever, pain , or abdominal swelling? No. Pain Score  0 *  Have you tolerated food without any problems? Yes.    Have you been able to return to your normal activities? Yes.    Do you have any questions about your discharge instructions: Diet   No. Medications  No. Follow up visit  No.  Do you have questions or concerns about your Care? No.  Actions: * If pain score is 4 or above: No action needed, pain <4  1. Have you developed a fever since your procedure? NO  2.   Have you had an respiratory symptoms (SOB or cough) since your procedure? NO  3.   Have you tested positive for COVID 19 since your procedure NO  4.   Have you had any family members/close contacts diagnosed with the COVID 19 since your procedure?  NO   If yes to any of these questions please route to Laverna Peace, RN and Karlton Lemon, RN

## 2020-12-27 ENCOUNTER — Encounter: Payer: Self-pay | Admitting: Gastroenterology

## 2021-02-04 DIAGNOSIS — F4322 Adjustment disorder with anxiety: Secondary | ICD-10-CM | POA: Diagnosis not present

## 2021-02-18 DIAGNOSIS — F4322 Adjustment disorder with anxiety: Secondary | ICD-10-CM | POA: Diagnosis not present

## 2021-03-18 DIAGNOSIS — F4322 Adjustment disorder with anxiety: Secondary | ICD-10-CM | POA: Diagnosis not present

## 2021-03-30 IMAGING — MR MR HEAD W/O CM
12 of 13 series · 44 of 48 positions shown · non-contrast
Comparison: Prior head CT from 09/10/2020.

CLINICAL DATA: Initial evaluation for acute headache, facial
droop/tingling.

EXAM:
MRI HEAD WITHOUT CONTRAST
TECHNIQUE: Multiplanar, multiecho pulse sequences of the brain and surrounding
structures were obtained without intravenous contrast.

[Series 9: DWI · axial · 3.0mm · 0.88mm/px · z∈[-163,-27]mm · 8 of 104 slices shown (1 of 4)]
[im 1/104]
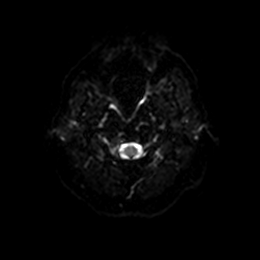
[im 15/104]
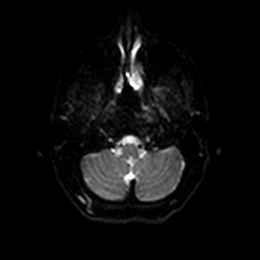
[im 30/104]
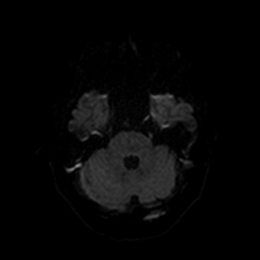
[im 45/104]
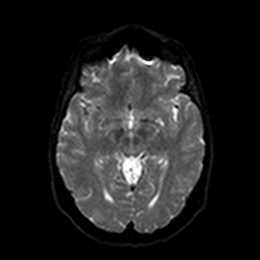
[im 59/104]
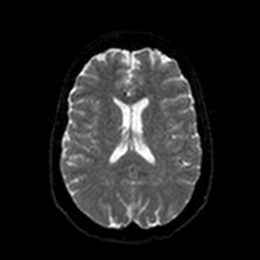
[im 74/104]
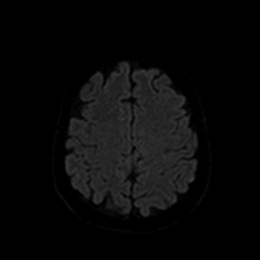
[im 89/104]
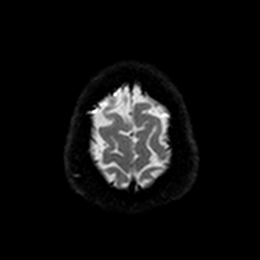
[im 104/104]
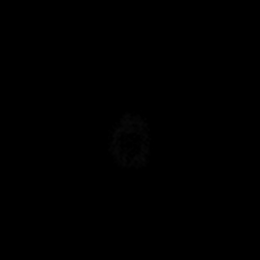

[Series 10: DWI · axial · 3.0mm · 0.88mm/px · z∈[-163,-27]mm · 4 of 52 slices shown (2 of 4)]
[im 1/52]
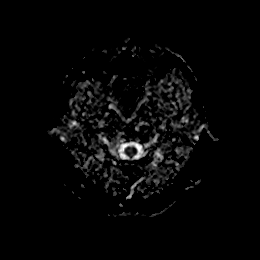
[im 18/52]
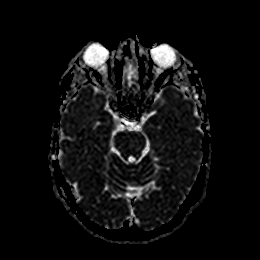
[im 35/52]
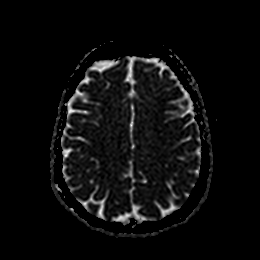
[im 52/52]
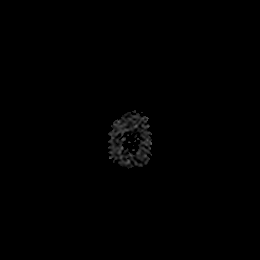

[Series 11: DWI · coronal · 4.0mm · 0.88mm/px · 6 of 76 slices shown (3 of 4)]
[im 1/76]
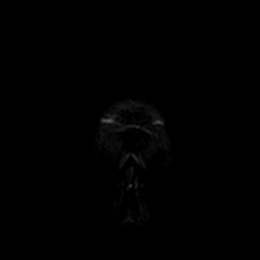
[im 16/76]
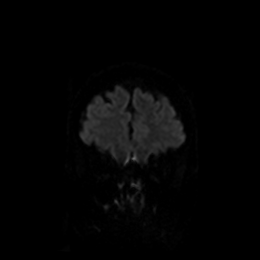
[im 31/76]
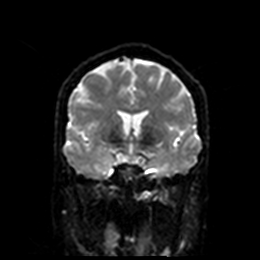
[im 46/76]
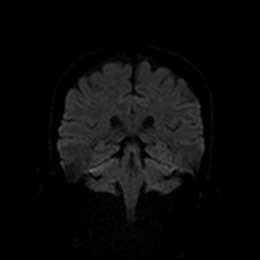
[im 61/76]
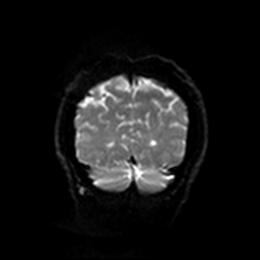
[im 76/76]
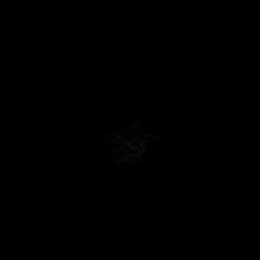

[Series 12: DWI · coronal · 4.0mm · 0.88mm/px · 3 of 38 slices shown (4 of 4)]
[im 1/38]
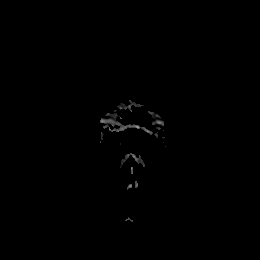
[im 19/38]
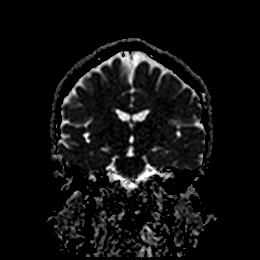
[im 38/38]
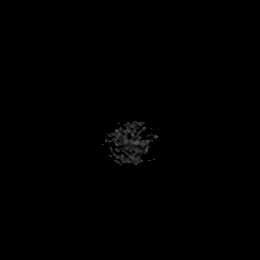

[Series 13: T1 · sagittal · 5.0mm · 0.75mm/px · 2 of 25 slices shown]
[im 1/25]
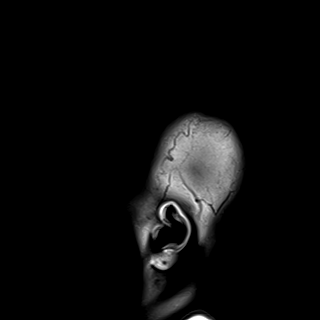
[im 25/25]
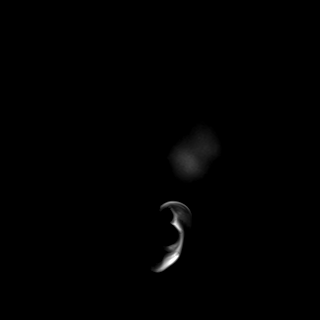

[Series 14: T2 · axial · 5.0mm · 0.72mm/px · z∈[-166,-27]mm · 2 of 27 slices shown (1 of 2)]
[im 1/27]
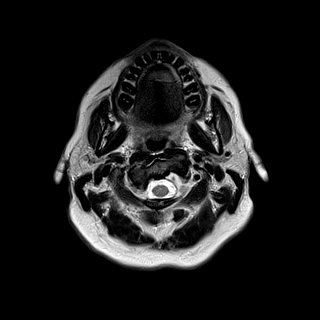
[im 27/27]
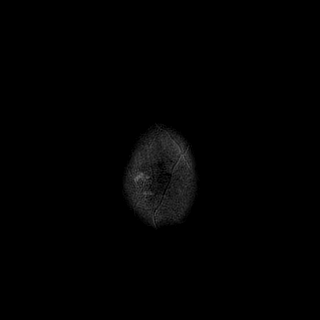

[Series 15: FLAIR · axial · 5.0mm · 0.45mm/px · z∈[-162,-24]mm · 2 of 27 slices shown]
[im 1/27]
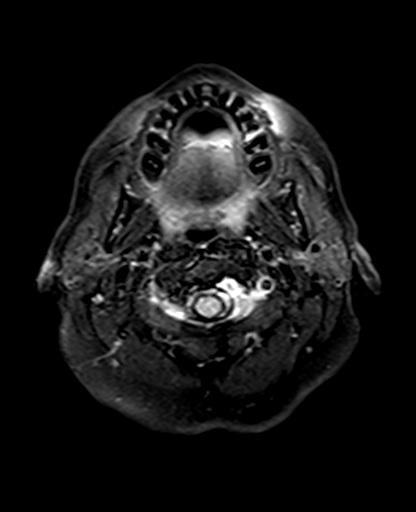
[im 27/27]
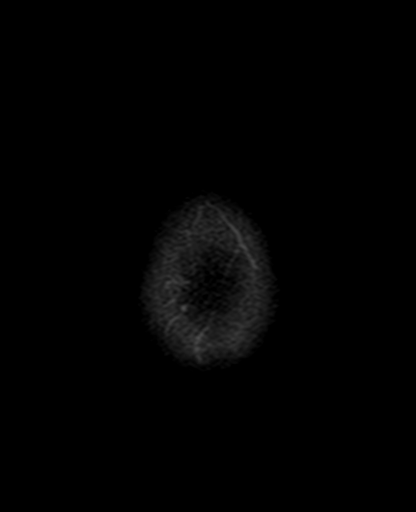

[Series 16: mag_images · axial · 3.0mm · 0.90mm/px · z∈[-159,-23]mm · 4 of 52 slices shown]
[im 1/52]
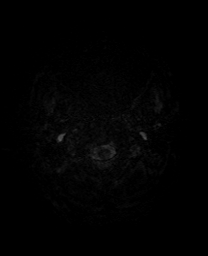
[im 18/52]
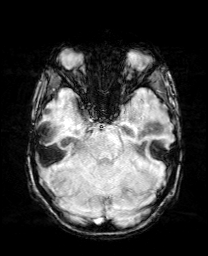
[im 35/52]
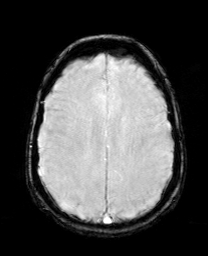
[im 52/52]
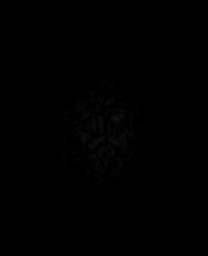

[Series 17: pha_images · axial · 3.0mm · 0.90mm/px · z∈[-159,-25]mm · 4 of 51 slices shown]
[im 1/51]
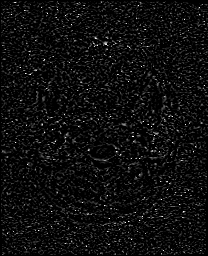
[im 17/51]
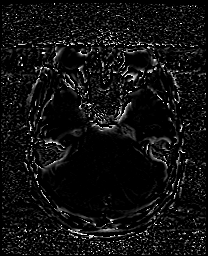
[im 34/51]
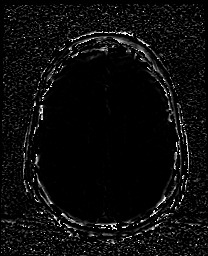
[im 51/51]
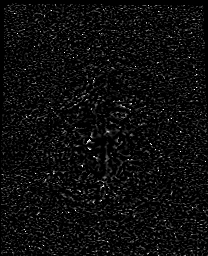

[Series 18: swi_images · axial · 3.0mm · 0.90mm/px · z∈[-159,-23]mm · 4 of 52 slices shown]
[im 1/52]
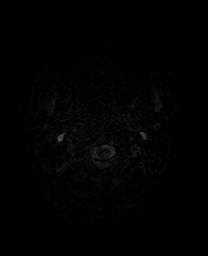
[im 18/52]
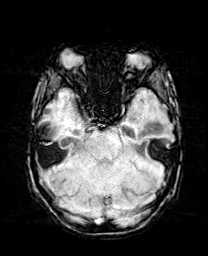
[im 35/52]
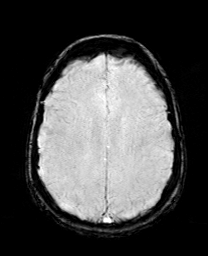
[im 52/52]
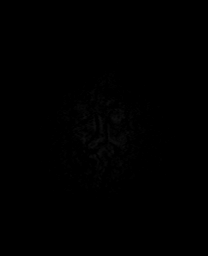

[Series 19: mip_images(sw) · axial · 24.0mm · 0.90mm/px · z∈[-150,-32]mm · 3 of 45 slices shown]
[im 1/45]
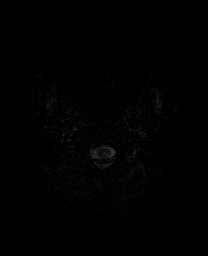
[im 23/45]
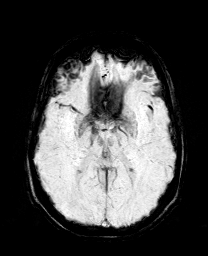
[im 45/45]
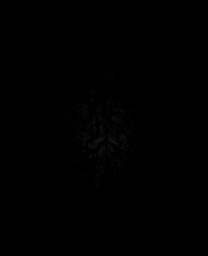

[Series 21: T2 · coronal · 5.0mm · 0.34mm/px · 2 of 31 slices shown (2 of 2)]
[im 1/31]
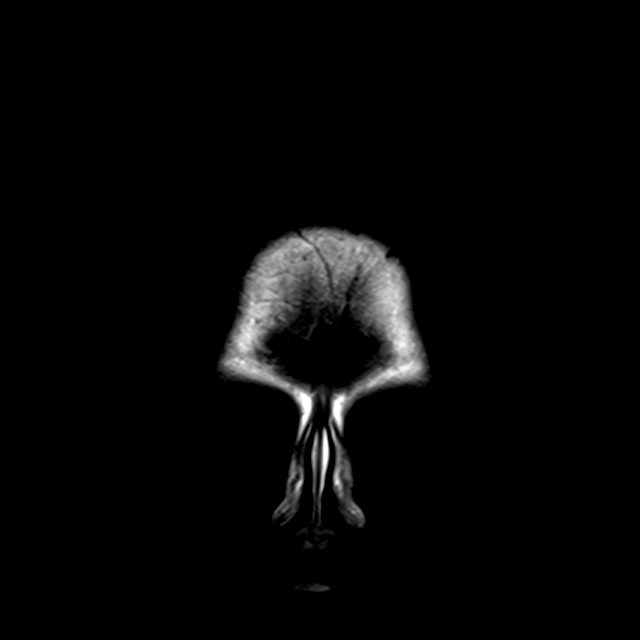
[im 31/31]
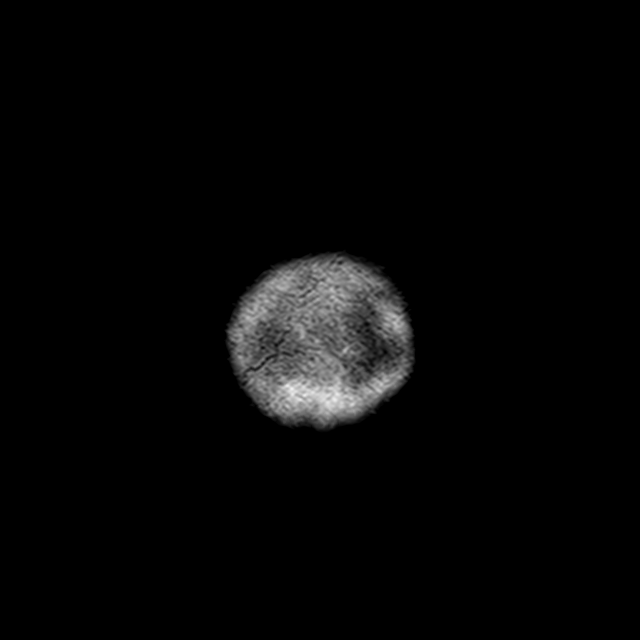

[44 of 48 positions shown; findings below may reference images not displayed]

FINDINGS: Brain: Cerebral volume within normal limits for patient age. No
focal parenchymal signal abnormality identified.

No abnormal foci of restricted diffusion to suggest acute or
subacute ischemia. Gray-white matter differentiation well
maintained. No encephalomalacia to suggest chronic infarction. No
foci of susceptibility artifact to suggest acute or chronic
intracranial hemorrhage.

No mass lesion, midline shift or mass effect. No hydrocephalus. No
extra-axial fluid collection. Major dural sinuses are grossly
patent.

Pituitary gland and suprasellar region are normal. Midline
structures intact and normal.

Vascular: Major intracranial vascular flow voids well maintained and
normal in appearance.

Skull and upper cervical spine: Craniocervical junction normal.
Visualized upper cervical spine within normal limits. Bone marrow
signal intensity normal. No scalp soft tissue abnormality.

Sinuses/Orbits: Globes and orbital soft tissues within normal
limits.

Paranasal sinuses are clear. No mastoid effusion. Inner ear
structures normal.

Other: None.
IMPRESSION: Normal brain MRI. No acute intracranial abnormality identified.

## 2021-04-01 DIAGNOSIS — F4322 Adjustment disorder with anxiety: Secondary | ICD-10-CM | POA: Diagnosis not present

## 2021-04-17 DIAGNOSIS — F4322 Adjustment disorder with anxiety: Secondary | ICD-10-CM | POA: Diagnosis not present

## 2021-04-22 DIAGNOSIS — F4322 Adjustment disorder with anxiety: Secondary | ICD-10-CM | POA: Diagnosis not present

## 2021-04-29 DIAGNOSIS — F4322 Adjustment disorder with anxiety: Secondary | ICD-10-CM | POA: Diagnosis not present

## 2021-05-06 DIAGNOSIS — F4322 Adjustment disorder with anxiety: Secondary | ICD-10-CM | POA: Diagnosis not present

## 2021-05-07 DIAGNOSIS — Z1231 Encounter for screening mammogram for malignant neoplasm of breast: Secondary | ICD-10-CM | POA: Diagnosis not present

## 2021-05-20 DIAGNOSIS — F4322 Adjustment disorder with anxiety: Secondary | ICD-10-CM | POA: Diagnosis not present

## 2021-05-27 DIAGNOSIS — F4322 Adjustment disorder with anxiety: Secondary | ICD-10-CM | POA: Diagnosis not present

## 2021-06-03 DIAGNOSIS — F4322 Adjustment disorder with anxiety: Secondary | ICD-10-CM | POA: Diagnosis not present

## 2021-06-12 ENCOUNTER — Encounter (HOSPITAL_BASED_OUTPATIENT_CLINIC_OR_DEPARTMENT_OTHER): Payer: Self-pay | Admitting: Obstetrics & Gynecology

## 2021-06-20 DIAGNOSIS — F4322 Adjustment disorder with anxiety: Secondary | ICD-10-CM | POA: Diagnosis not present

## 2021-06-24 DIAGNOSIS — F4322 Adjustment disorder with anxiety: Secondary | ICD-10-CM | POA: Diagnosis not present

## 2021-07-01 DIAGNOSIS — F4322 Adjustment disorder with anxiety: Secondary | ICD-10-CM | POA: Diagnosis not present

## 2021-07-17 DIAGNOSIS — F4322 Adjustment disorder with anxiety: Secondary | ICD-10-CM | POA: Diagnosis not present

## 2021-07-24 DIAGNOSIS — F4322 Adjustment disorder with anxiety: Secondary | ICD-10-CM | POA: Diagnosis not present

## 2021-07-29 DIAGNOSIS — F4322 Adjustment disorder with anxiety: Secondary | ICD-10-CM | POA: Diagnosis not present

## 2021-08-12 DIAGNOSIS — F4322 Adjustment disorder with anxiety: Secondary | ICD-10-CM | POA: Diagnosis not present

## 2021-09-05 DIAGNOSIS — F4322 Adjustment disorder with anxiety: Secondary | ICD-10-CM | POA: Diagnosis not present

## 2021-09-23 DIAGNOSIS — F4322 Adjustment disorder with anxiety: Secondary | ICD-10-CM | POA: Diagnosis not present

## 2021-09-24 DIAGNOSIS — Z0000 Encounter for general adult medical examination without abnormal findings: Secondary | ICD-10-CM | POA: Diagnosis not present

## 2021-09-24 DIAGNOSIS — Z13220 Encounter for screening for lipoid disorders: Secondary | ICD-10-CM | POA: Diagnosis not present

## 2021-09-24 DIAGNOSIS — Z23 Encounter for immunization: Secondary | ICD-10-CM | POA: Diagnosis not present

## 2021-09-29 DIAGNOSIS — F4322 Adjustment disorder with anxiety: Secondary | ICD-10-CM | POA: Diagnosis not present

## 2021-10-07 DIAGNOSIS — F4322 Adjustment disorder with anxiety: Secondary | ICD-10-CM | POA: Diagnosis not present

## 2021-10-13 ENCOUNTER — Encounter (HOSPITAL_BASED_OUTPATIENT_CLINIC_OR_DEPARTMENT_OTHER): Payer: Self-pay | Admitting: Obstetrics & Gynecology

## 2021-10-13 ENCOUNTER — Ambulatory Visit (INDEPENDENT_AMBULATORY_CARE_PROVIDER_SITE_OTHER): Payer: BC Managed Care – PPO | Admitting: Obstetrics & Gynecology

## 2021-10-13 ENCOUNTER — Other Ambulatory Visit: Payer: Self-pay

## 2021-10-13 ENCOUNTER — Other Ambulatory Visit (HOSPITAL_COMMUNITY)
Admission: RE | Admit: 2021-10-13 | Discharge: 2021-10-13 | Disposition: A | Discharge status: Home / Self Care | Payer: BC Managed Care – PPO | Source: Ambulatory Visit | Attending: Obstetrics & Gynecology | Admitting: Obstetrics & Gynecology

## 2021-10-13 VITALS — BP 120/78 | HR 80 | Ht 63.0 in | Wt 171.6 lb

## 2021-10-13 DIAGNOSIS — G43009 Migraine without aura, not intractable, without status migrainosus: Secondary | ICD-10-CM

## 2021-10-13 DIAGNOSIS — Z01419 Encounter for gynecological examination (general) (routine) without abnormal findings: Secondary | ICD-10-CM

## 2021-10-13 DIAGNOSIS — Z124 Encounter for screening for malignant neoplasm of cervix: Secondary | ICD-10-CM

## 2021-10-13 DIAGNOSIS — Z8249 Family history of ischemic heart disease and other diseases of the circulatory system: Secondary | ICD-10-CM

## 2021-10-13 DIAGNOSIS — M542 Cervicalgia: Secondary | ICD-10-CM

## 2021-10-13 DIAGNOSIS — Z90710 Acquired absence of both cervix and uterus: Secondary | ICD-10-CM

## 2021-10-13 MED ORDER — ESTRADIOL 1 MG PO TABS: 1.0000 mg | ORAL_TABLET | Freq: Every day | ORAL | 4 refills | Status: DC

## 2021-10-13 MED ORDER — DULOXETINE HCL 60 MG PO CPEP: 60.0000 mg | ORAL_CAPSULE | Freq: Every day | ORAL | 4 refills | Status: DC

## 2021-10-13 NOTE — Progress Notes (Addendum)
50 y.o. G68P2004 Divorced White or Caucasian female here for annual exam.  Doing well.  Younger twins are now driving.  Had emergent gallbladder removal done last year over Thanksgiving break.  Was in [place], [place], visiting boyfriend's family.  He does not have children.    Denies vaginal bleeding.    Patient's last menstrual period was 07/03/2018 (exact date).          Sexually active: No.  The current method of family planning is status post hysterectomy.    Exercising: Yes.     Working with a Clinical research associate Smoker:  no  Health Maintenance: Pap:  05/02/2018 Negative History of abnormal Pap:  no MMG:  06/12/2021 Negative Colonoscopy:  12/18/2020, Dr. Havery Moros.  Follow up 10 years. BMD:   not indicated yet Screening Labs: Done this year with Dr. Nancy Fetter   reports that she has never smoked. She has never used smokeless tobacco. She reports current alcohol use of about 2.0 standard drinks per week. She reports that she does not use drugs.  Past Medical History:  Diagnosis Date   Anemia    Depression    Elevated blood-pressure reading without diagnosis of hypertension 03/13/2017   Exercise-induced asthma    Childhood   Female stress incontinence 05/02/2018   GERD (gastroesophageal reflux disease)    history after decadron reaction   Infertility, female    Insomnia    Migraine without aura and without status migrainosus, not intractable 03/13/2017   Mixed hyperlipidemia 03/13/2017   PCOS (polycystic ovarian syndrome)    Seasonal allergies    Sleep apnea    does not use CPAP    Past Surgical History:  Procedure Laterality Date   BLADDER SUSPENSION     CHOLECYSTECTOMY, LAPAROSCOPIC  11/13/2020   CYSTOSCOPY N/A 07/25/2018   Procedure: CYSTOSCOPY possible cysto;  Surgeon: Megan Salon, MD;  Location: [facility];  Service: Gynecology;  Laterality: N/A;  possible cysto   FOOT SURGERY Right 2020   TOTAL LAPAROSCOPIC HYSTERECTOMY WITH SALPINGECTOMY Bilateral 07/25/2018    Procedure: TOTAL LAPAROSCOPIC HYSTERECTOMY WITH SALPINGECTOMY possible BSO;  Surgeon: Megan Salon, MD;  Location: [facility];  Service: Gynecology;  Laterality: Bilateral;  possible BSO   URETHRAL SLING  09/14/2012   [facility]   WISDOM TOOTH EXTRACTION      Current Outpatient Medications  Medication Sig Dispense Refill   Atogepant (QULIPTA) 60 MG TABS Take 60 mg by mouth daily.     B Complex Vitamins (B COMPLEX PO) Take by mouth.     cetirizine (ZYRTEC) 10 MG tablet Take 10 mg by mouth daily as needed for allergies.     chlorzoxazone (PARAFON) 500 MG tablet Take by mouth 4 (four) times daily as needed for muscle spasms.     cholecalciferol (VITAMIN D) 1000 units tablet Take 1,000 Units by mouth daily. With calcium     DULoxetine (CYMBALTA) 60 MG capsule Take 1 capsule (60 mg total) by mouth daily.     eletriptan (RELPAX) 40 MG tablet Take 40 mg by mouth every 2 (two) hours as needed for migraine or headache.      estradiol (ESTRACE) 1 MG tablet Take 1 tablet (1 mg total) by mouth daily. 90 tablet 4   Multiple Vitamins-Minerals (SUPER THERA VITE M) TABS Take 1 tablet by mouth daily.      OnabotulinumtoxinA (BOTOX IJ) Inject 1 each into the muscle every 3 (three) months.      Probiotic Product (PROBIOTIC-10) CAPS Take 1 capsule by  mouth daily.      traZODone (DESYREL) 50 MG tablet Take 50 mg by mouth at bedtime.      Diclofenac Potassium 25 MG CAPS Take 25 mg by mouth daily as needed (for back and neck pain).  (Patient not taking: Reported on 10/13/2021)     Galcanezumab-gnlm 120 MG/ML SOSY Inject 120 mg into the skin every 30 (thirty) days.  (Patient not taking: Reported on 10/13/2021)     Rimegepant Sulfate (NURTEC) 75 MG TBDP Take by mouth. (Patient not taking: Reported on 10/13/2021)     No current facility-administered medications for this visit.    Family History  Problem Relation Age of Onset   Prostate cancer Father    Skin cancer Father    Cardiomyopathy Sister     Ovarian cysts Sister    Colon polyps Neg Hx    Colon cancer Neg Hx    Esophageal cancer Neg Hx    Rectal cancer Neg Hx    Stomach cancer Neg Hx     Review of Systems  Neurological:  Negative for seizures and headaches.  All other systems reviewed and are negative.  Exam:   BP 120/78 (BP Location: Left Arm, Patient Position: Sitting, Cuff Size: Large)   Pulse 80   Ht 5\' 3"  (1.6 m)   Wt 171 lb 9.6 oz (77.8 kg)   LMP 07/03/2018 (Exact Date) Comment: Hysterectomy 07/25/2018  BMI 30.40 kg/m   Height: 5\' 3"  (160 cm)  General appearance: alert, cooperative and appears stated age Head: Normocephalic, without obvious abnormality, atraumatic Neck: no adenopathy, supple, symmetrical, trachea midline and thyroid normal to inspection and palpation Lungs: clear to auscultation bilaterally Breasts: normal appearance, no masses or tenderness Heart: regular rate and rhythm Abdomen: soft, non-tender; bowel sounds normal; no masses,  no organomegaly Extremities: extremities normal, atraumatic, no cyanosis or edema Skin: Skin color, texture, turgor normal. No rashes or lesions Lymph nodes: Cervical, supraclavicular, and axillary nodes normal. No abnormal inguinal nodes palpated Neurologic: Grossly normal   Pelvic: External genitalia:  no lesions              Urethra:  normal appearing urethra with no masses, tenderness or lesions              Bartholins and Skenes: normal                 Vagina: normal appearing vagina with normal color and no discharge, no lesions              Cervix: absent              Pap taken: Yes.   Bimanual Exam:  Uterus:  uterus absent              Adnexa: normal adnexa and no mass, fullness, tenderness               Rectovaginal: Confirms               Anus:  normal sphincter tone, no lesions  Chaperone, Octaviano Batty, CMA, was present for exam.  Assessment/Plan: 1. Well woman exam with routine gynecological exam - pap with HR HPV obtained today due to new  partner - MMG 05/2021 - colonoscopy done 10/2020 - discussed BMD/BSO, cystoscopy 07/2018 - lab work done with Dr. Nancy Fetter - care gaps updated/reviewed  2. Cervical cancer screening - Cytology - PAP( [place])  3. H/O: hysterectomy/bilateral salpingectomy - estradiol (ESTRACE) 1 MG tablet; Take 1 tablet (1 mg total)  by mouth daily.  Dispense: 90 tablet; Refill: 4 (she takes 1/2 tab BID).    4. Migraine without aura and without status migrainosus, not intractable - Followed by Dr. Winfred Burn  5. Neck pain - DULoxetine (CYMBALTA) 60 MG capsule; Take 1 capsule (60 mg total) by mouth daily.  Dispense: 90 capsule; Refill: 4  6. Family history of cardiovascular disease - followed by Dr. Harrington Challenger - Is followed every 3 months

## 2021-10-14 LAB — CYTOLOGY - PAP
Comment: NEGATIVE
Diagnosis: NEGATIVE
High risk HPV: NEGATIVE

## 2021-10-20 DIAGNOSIS — F4322 Adjustment disorder with anxiety: Secondary | ICD-10-CM | POA: Diagnosis not present

## 2021-10-28 DIAGNOSIS — F4322 Adjustment disorder with anxiety: Secondary | ICD-10-CM | POA: Diagnosis not present

## 2021-11-04 DIAGNOSIS — F4322 Adjustment disorder with anxiety: Secondary | ICD-10-CM | POA: Diagnosis not present

## 2021-11-18 DIAGNOSIS — F4322 Adjustment disorder with anxiety: Secondary | ICD-10-CM | POA: Diagnosis not present

## 2021-12-02 DIAGNOSIS — F4322 Adjustment disorder with anxiety: Secondary | ICD-10-CM | POA: Diagnosis not present

## 2021-12-03 DIAGNOSIS — U071 COVID-19: Secondary | ICD-10-CM | POA: Diagnosis not present

## 2021-12-03 DIAGNOSIS — Z20822 Contact with and (suspected) exposure to covid-19: Secondary | ICD-10-CM | POA: Diagnosis not present

## 2021-12-23 DIAGNOSIS — F4322 Adjustment disorder with anxiety: Secondary | ICD-10-CM | POA: Diagnosis not present

## 2022-01-20 DIAGNOSIS — F4322 Adjustment disorder with anxiety: Secondary | ICD-10-CM | POA: Diagnosis not present

## 2022-01-21 DIAGNOSIS — M65871 Other synovitis and tenosynovitis, right ankle and foot: Secondary | ICD-10-CM | POA: Diagnosis not present

## 2022-01-21 DIAGNOSIS — M25571 Pain in right ankle and joints of right foot: Secondary | ICD-10-CM | POA: Diagnosis not present

## 2022-01-21 DIAGNOSIS — M7731 Calcaneal spur, right foot: Secondary | ICD-10-CM | POA: Diagnosis not present

## 2022-02-04 DIAGNOSIS — M722 Plantar fascial fibromatosis: Secondary | ICD-10-CM | POA: Diagnosis not present

## 2022-02-04 DIAGNOSIS — M25571 Pain in right ankle and joints of right foot: Secondary | ICD-10-CM | POA: Diagnosis not present

## 2022-02-18 DIAGNOSIS — M722 Plantar fascial fibromatosis: Secondary | ICD-10-CM | POA: Diagnosis not present

## 2022-02-18 DIAGNOSIS — M71571 Other bursitis, not elsewhere classified, right ankle and foot: Secondary | ICD-10-CM | POA: Diagnosis not present

## 2022-03-04 DIAGNOSIS — M71571 Other bursitis, not elsewhere classified, right ankle and foot: Secondary | ICD-10-CM | POA: Diagnosis not present

## 2022-03-04 DIAGNOSIS — M722 Plantar fascial fibromatosis: Secondary | ICD-10-CM | POA: Diagnosis not present

## 2022-03-10 DIAGNOSIS — D225 Melanocytic nevi of trunk: Secondary | ICD-10-CM | POA: Diagnosis not present

## 2022-03-10 DIAGNOSIS — L814 Other melanin hyperpigmentation: Secondary | ICD-10-CM | POA: Diagnosis not present

## 2022-03-10 DIAGNOSIS — D2239 Melanocytic nevi of other parts of face: Secondary | ICD-10-CM | POA: Diagnosis not present

## 2022-03-10 DIAGNOSIS — L578 Other skin changes due to chronic exposure to nonionizing radiation: Secondary | ICD-10-CM | POA: Diagnosis not present

## 2022-03-12 DIAGNOSIS — M25561 Pain in right knee: Secondary | ICD-10-CM | POA: Diagnosis not present

## 2022-03-23 DIAGNOSIS — M25561 Pain in right knee: Secondary | ICD-10-CM | POA: Diagnosis not present

## 2022-04-06 DIAGNOSIS — M25561 Pain in right knee: Secondary | ICD-10-CM | POA: Diagnosis not present

## 2022-04-13 ENCOUNTER — Other Ambulatory Visit: Payer: Self-pay

## 2022-04-13 NOTE — Progress Notes (Signed)
Opened in error

## 2022-04-14 ENCOUNTER — Ambulatory Visit: Payer: BC Managed Care – PPO | Admitting: Internal Medicine

## 2022-04-14 ENCOUNTER — Encounter: Payer: Self-pay | Admitting: Internal Medicine

## 2022-04-14 VITALS — BP 122/86 | HR 81 | Ht 65.0 in | Wt 178.4 lb

## 2022-04-14 DIAGNOSIS — I429 Cardiomyopathy, unspecified: Secondary | ICD-10-CM

## 2022-04-14 NOTE — Progress Notes (Signed)
? ?Cardiology Office Note ? ? ?Date:  04/14/2022  ? ?ID:  CINTHIA RODDEN, DOB 1971/07/06, MRN [id number] ? ?PCP:  Donald Prose, MD  ?Cardiologist:   Dorris Carnes, MD  ? ?Pt presents for follow up of cardiac screening  ?  ?History of Present Illness: ?Kamarah A Longo is a 51 y.o. female with a no known cardiac Hx    Sister who lives in [place] has [place] with a cardiologist there   She is in a home     ? ?The pt says since seen she has felt good  Active    Has 2 kids still at home    ?Deneis CP  Breathing is OK  no dizzness   No palpitations    ?Exercises  ? ?Brother had a spell of bradycardia and afib    52 yo   Now has PPM ? ? ?Diet:    ?Breakfast:   Oatmeal or bran cereal   Coffee ?Lunch   [place] sandwich or greek yogurt/nuts or salad ?Dinner  Chicken and veggies   ? ?Snacks  Doing more recently   Shakes with  Protein powder   Low sugar   ? ? ? ?Current Meds  ?Medication Sig  ? Atogepant (QULIPTA) 60 MG TABS Take 60 mg by mouth daily.  ? B Complex Vitamins (B COMPLEX PO) Take 1 capsule by mouth daily.  ? cetirizine (ZYRTEC) 10 MG tablet Take 10 mg by mouth daily as needed for allergies.  ? chlorzoxazone (PARAFON) 500 MG tablet Take by mouth 4 (four) times daily as needed for muscle spasms.  ? cholecalciferol (VITAMIN D) 1000 units tablet Take 1,000 Units by mouth daily. With calcium  ? Diclofenac Potassium 25 MG CAPS Take 25 mg by mouth daily as needed (for back and neck pain).  ? DULoxetine (CYMBALTA) 30 MG capsule Take 30 mg by mouth daily.  ? eletriptan (RELPAX) 40 MG tablet Take 40 mg by mouth every 2 (two) hours as needed for migraine or headache.   ? estradiol (ESTRACE) 1 MG tablet Take 1 tablet (1 mg total) by mouth daily.  ? Galcanezumab-gnlm 120 MG/ML SOSY Inject 120 mg into the skin every 30 (thirty) days.  ? Multiple Vitamins-Minerals (SUPER THERA VITE M) TABS Take 1 tablet by mouth daily.   ? OnabotulinumtoxinA (BOTOX IJ) Inject 1 each into the muscle every 3 (three) months.   ? Probiotic Product  (PROBIOTIC-10) CAPS Take 1 capsule by mouth daily.   ? traZODone (DESYREL) 50 MG tablet Take 50 mg by mouth at bedtime.   ? ? ? ?Allergies:   Dexamethasone, Dexamethasone sodium phosphate, Other, Pseudoephedrine hcl, and Pseudoephedrine  ? ?Past Medical History:  ?Diagnosis Date  ? Depression   ? Elevated blood-pressure reading without diagnosis of hypertension 03/13/2017  ? Exercise-induced asthma   ? Childhood  ? Female stress incontinence 05/02/2018  ? GERD (gastroesophageal reflux disease)   ? history after decadron reaction  ? Infertility, female   ? Insomnia   ? Migraine without aura and without status migrainosus, not intractable 03/13/2017  ? Mixed hyperlipidemia 03/13/2017  ? PCOS (polycystic ovarian syndrome)   ? Seasonal allergies   ? Sleep apnea   ? does not use CPAP  ? ? ?Past Surgical History:  ?Procedure Laterality Date  ? BLADDER SUSPENSION    ? CHOLECYSTECTOMY, LAPAROSCOPIC  11/13/2020  ? CYSTOSCOPY N/A 07/25/2018  ? Procedure: CYSTOSCOPY possible cysto;  Surgeon: Megan Salon, MD;  Location[facility];  Service: Gynecology;  Laterality: N/A;  possible cysto  ? FOOT SURGERY Right 2020  ? TOTAL LAPAROSCOPIC HYSTERECTOMY WITH SALPINGECTOMY Bilateral 07/25/2018  ? Procedure: TOTAL LAPAROSCOPIC HYSTERECTOMY WITH SALPINGECTOMY possible BSO;  Surgeon: Megan Salon, MD;  Location: [facility];  Service: Gynecology;  Laterality: Bilateral;  possible BSO  ? URETHRAL SLING  09/14/2012  ? [place]  ? WISDOM TOOTH EXTRACTION    ? ? ? ?Social History:  The patient  reports that she has never smoked. She has never used smokeless tobacco. She reports current alcohol use of about 2.0 standard drinks per week. She reports that she does not use drugs.  ? ?Family History:  The patient's family history includes Cardiomyopathy in her sister; Ovarian cysts in her sister; Prostate cancer in her father; Skin cancer in her father.  ? ? ?ROS:  Please see the history of present illness. All other  systems are reviewed and  Negative to the above problem except as noted.  ? ? ?PHYSICAL EXAM: ?VS:  BP 122/86   Pulse 81   Ht '5\' 5"'$  (1.651 m)   Wt 178 lb 6.4 oz (80.9 kg)   LMP 07/03/2018 (Exact Date) Comment: Hysterectomy 07/25/2018  SpO2 97%   BMI 29.69 kg/m?   ?GEN: Overweight   in no acute distress  ?HEENT: normal  ?Neck: no JVD, carotid bruits, ?Cardiac: RRR; no murmurs,   No LE  edema  ?Respiratory:  clear to auscultation bilaterally, ?GI: soft, nontender, nondistended, + BS  No hepatomegaly  ?MS: no deformity Moving all extremities   ?Skin: warm and dry, no rash ?Neuro:  Strength and sensation are intact ?Psych: euthymic mood, full affect ? ? ?EKG:  EKG is ordered today.  SR 81  Nonspeicific ST changes  with sl ST depression III, AVF   ? ? ?Lipid Panel ?   ?Component Value Date/Time  ? CHOL 215 (H) 10/17/2020 0951  ? TRIG 168 (H) 10/17/2020 0951  ? HDL 65 10/17/2020 0951  ? CHOLHDL 3.3 10/17/2020 0951  ? CHOLHDL 3 05/20/2018 0742  ? VLDL 32.8 05/20/2018 0742  ? LDLCALC 121 (H) 10/17/2020 0951  ? ?  ? ?Wt Readings from Last 3 Encounters:  ?04/14/22 178 lb 6.4 oz (80.9 kg)  ?10/13/21 171 lb 9.6 oz (77.8 kg)  ?12/18/20 172 lb (78 kg)  ?  ? ? ?ASSESSMENT AND PLAN: ? ?1  FHx HOCM    Will get a repeat echo to compare    EKG with nonspecific changes   ?Pt will check to see if sister had any other testing  ? ?2  Lipids   OK   LDL 113  HDL 77    ? ?Discussed diet   Low carbs    ? ? ?Plan for  ? ? ? ?Current medicines are reviewed at length with the patient today.  The patient does not have concerns regarding medicines. ? ?Signed, ?Dorris Carnes, MD  ?04/14/2022 8:17 AM    ?[place] ?[place], [place], [place]  [address] ?Phone: [phone]; Fax: [phone]  ? ? ?

## 2022-04-14 NOTE — Patient Instructions (Addendum)
Medication Instructions:  ?Your physician recommends that you continue on your current medications as directed. Please refer to the Current Medication list given to you today. ? ?*If you need a refill on your cardiac medications before your next appointment, please call your pharmacy* ? ? ?Lab Work: ?none ?If you have labs (blood work) drawn today and your tests are completely normal, you will receive your results only by: ?MyChart Message (if you have MyChart) OR ?A paper copy in the mail ?If you have any lab test that is abnormal or we need to change your treatment, we will call you to review the results. ? ? ?Testing/Procedures: ?Your physician has requested that you have an echocardiogram. Echocardiography is a painless test that uses sound waves to create images of your heart. It provides your doctor with information about the size and shape of your heart and how well your heart?s chambers and valves are working. This procedure takes approximately one hour. There are no restrictions for this procedure. ? ? ? ?Follow-Up: ?At [facility], you and your health needs are our priority.  As part of our continuing mission to provide you with exceptional heart care, we have created designated Provider Care Teams.  These Care Teams include your primary Cardiologist (physician) and Advanced Practice Providers (APPs -  Physician Assistants and Nurse Practitioners) who all work together to provide you with the care you need, when you need it. ? ?We recommend signing up for the patient portal called "MyChart".  Sign up information is provided on this After Visit Summary.  MyChart is used to connect with patients for Virtual Visits (Telemedicine).  Patients are able to view lab/test results, encounter notes, upcoming appointments, etc.  Non-urgent messages can be sent to your provider as well.   ?To learn more about what you can do with MyChart, go to [email or web address].   ? ?Your next appointment:   ?4 year(s) ? ?The  format for your next appointment:   ?In Person ? ?Provider:   ?None   ? ? ?Other Instructions ? ? ?Important Information About Sugar ? ? ? ? ?  ?

## 2022-04-19 DIAGNOSIS — J069 Acute upper respiratory infection, unspecified: Secondary | ICD-10-CM | POA: Diagnosis not present

## 2022-04-19 DIAGNOSIS — Z20822 Contact with and (suspected) exposure to covid-19: Secondary | ICD-10-CM | POA: Diagnosis not present

## 2022-04-19 DIAGNOSIS — R059 Cough, unspecified: Secondary | ICD-10-CM | POA: Diagnosis not present

## 2022-05-06 ENCOUNTER — Ambulatory Visit (HOSPITAL_COMMUNITY): Payer: BC Managed Care – PPO | Attending: Cardiology

## 2022-05-06 DIAGNOSIS — I429 Cardiomyopathy, unspecified: Secondary | ICD-10-CM | POA: Insufficient documentation

## 2022-05-06 DIAGNOSIS — I428 Other cardiomyopathies: Secondary | ICD-10-CM | POA: Diagnosis not present

## 2022-05-06 LAB — ECHOCARDIOGRAM COMPLETE
Area-P 1/2: 2.9 cm2
S' Lateral: 2.6 cm

## 2022-05-12 ENCOUNTER — Telehealth: Payer: Self-pay

## 2022-05-12 NOTE — Telephone Encounter (Signed)
-----   Message from Dorris Carnes V, MD sent at 05/09/2022 10:26 PM EDT ----- Echo shows LV size and wall thickness are normal  No hypertrophy. Pumping function of heart is normal Normal valve function   Recomm follow up in 5 years to assure no change

## 2022-05-12 NOTE — Telephone Encounter (Signed)
Pt asking when she should be seen back kin the office outside of her 5 year Echocardiogram.   Will forward to Dr Harrington Challenger.

## 2022-05-13 ENCOUNTER — Encounter (HOSPITAL_BASED_OUTPATIENT_CLINIC_OR_DEPARTMENT_OTHER): Payer: Self-pay | Admitting: *Deleted

## 2022-05-13 DIAGNOSIS — Z1231 Encounter for screening mammogram for malignant neoplasm of breast: Secondary | ICD-10-CM | POA: Diagnosis not present

## 2022-05-22 NOTE — Telephone Encounter (Signed)
She doesn't necessarily need to get seen unless having problems or has concerns.    Watch diet as we discussed    My Chart message sent to the pt.

## 2022-10-05 DIAGNOSIS — Z23 Encounter for immunization: Secondary | ICD-10-CM | POA: Diagnosis not present

## 2022-10-05 DIAGNOSIS — Z13220 Encounter for screening for lipoid disorders: Secondary | ICD-10-CM | POA: Diagnosis not present

## 2022-10-05 DIAGNOSIS — Z0000 Encounter for general adult medical examination without abnormal findings: Secondary | ICD-10-CM | POA: Diagnosis not present

## 2022-10-22 ENCOUNTER — Ambulatory Visit (INDEPENDENT_AMBULATORY_CARE_PROVIDER_SITE_OTHER): Payer: BC Managed Care – PPO | Admitting: Obstetrics & Gynecology

## 2022-10-22 ENCOUNTER — Encounter (HOSPITAL_BASED_OUTPATIENT_CLINIC_OR_DEPARTMENT_OTHER): Payer: Self-pay | Admitting: Obstetrics & Gynecology

## 2022-10-22 ENCOUNTER — Other Ambulatory Visit (HOSPITAL_COMMUNITY)
Admission: RE | Admit: 2022-10-22 | Discharge: 2022-10-22 | Disposition: A | Discharge status: Home / Self Care | Payer: BC Managed Care – PPO | Source: Ambulatory Visit | Attending: Obstetrics & Gynecology | Admitting: Obstetrics & Gynecology

## 2022-10-22 VITALS — BP 112/78 | HR 93 | Ht 62.75 in | Wt 167.6 lb

## 2022-10-22 DIAGNOSIS — N898 Other specified noninflammatory disorders of vagina: Secondary | ICD-10-CM

## 2022-10-22 DIAGNOSIS — Z01419 Encounter for gynecological examination (general) (routine) without abnormal findings: Secondary | ICD-10-CM | POA: Diagnosis not present

## 2022-10-22 DIAGNOSIS — G43009 Migraine without aura, not intractable, without status migrainosus: Secondary | ICD-10-CM | POA: Diagnosis not present

## 2022-10-22 DIAGNOSIS — Z90710 Acquired absence of both cervix and uterus: Secondary | ICD-10-CM

## 2022-10-22 DIAGNOSIS — E782 Mixed hyperlipidemia: Secondary | ICD-10-CM | POA: Diagnosis not present

## 2022-10-22 DIAGNOSIS — B3731 Acute candidiasis of vulva and vagina: Secondary | ICD-10-CM

## 2022-10-22 DIAGNOSIS — Z8249 Family history of ischemic heart disease and other diseases of the circulatory system: Secondary | ICD-10-CM

## 2022-10-22 DIAGNOSIS — Z79890 Hormone replacement therapy (postmenopausal): Secondary | ICD-10-CM

## 2022-10-22 MED ORDER — ESTRADIOL 1 MG PO TABS: 1.0000 mg | ORAL_TABLET | Freq: Every day | ORAL | 4 refills | Status: DC

## 2022-10-22 NOTE — Progress Notes (Signed)
51 y.o. G35P2004 Divorced White or Caucasian female here for annual exam.  Doing well.  Continues to see neurologist, Dr. Beatris Si, for botox injections.  Headaches continue to be under good control.    Saw Dr. Harrington Challenger in April.  Echo was normal.  Follow up imaging 5 years recommended.  This was done due to family hx.    On HRT and doing well.  Pap last year negative and HR HPV negative.  Together with same partner 3 years.    Having some vaginal discharge and is not worried about STDs.  Having some occasional and mild incontinence.  Patient's last menstrual period was 07/03/2018 (exact date).          Sexually active: Yes.    The current method of family planning is status post hysterectomy.    Smoker:  no  Health Maintenance: Pap:  negative History of abnormal Pap:  no MMG:  05/13/22 Colonoscopy:  12/18/20, Dr. Havery Moros, follow up 10 years BMD:   not indicated yet Screening Labs: with Dr. Nancy Fetter   reports that she has never smoked. She has never used smokeless tobacco. She reports current alcohol use of about 2.0 standard drinks of alcohol per week. She reports that she does not use drugs.  Past Medical History:  Diagnosis Date   Depression    Elevated blood-pressure reading without diagnosis of hypertension 03/13/2017   Exercise-induced asthma    Childhood   Female stress incontinence 05/02/2018   GERD (gastroesophageal reflux disease)    history after decadron reaction   Infertility, female    Insomnia    Migraine without aura and without status migrainosus, not intractable 03/13/2017   Mixed hyperlipidemia 03/13/2017   PCOS (polycystic ovarian syndrome)    Seasonal allergies    Sleep apnea    does not use CPAP    Past Surgical History:  Procedure Laterality Date   BLADDER SUSPENSION     CHOLECYSTECTOMY, LAPAROSCOPIC  11/13/2020   CYSTOSCOPY N/A 07/25/2018   Procedure: CYSTOSCOPY possible cysto;  Surgeon: Megan Salon, MD;  Location: [facility];   Service: Gynecology;  Laterality: N/A;  possible cysto   FOOT SURGERY Right 2020   TOTAL LAPAROSCOPIC HYSTERECTOMY WITH SALPINGECTOMY Bilateral 07/25/2018   Procedure: TOTAL LAPAROSCOPIC HYSTERECTOMY WITH SALPINGECTOMY possible BSO;  Surgeon: Megan Salon, MD;  Location: [facility];  Service: Gynecology;  Laterality: Bilateral;  possible BSO   URETHRAL SLING  09/14/2012   SPARC   WISDOM TOOTH EXTRACTION      Current Outpatient Medications  Medication Sig Dispense Refill   Atogepant (QULIPTA) 60 MG TABS Take 60 mg by mouth daily.     B Complex Vitamins (B COMPLEX PO) Take 1 capsule by mouth daily.     cetirizine (ZYRTEC) 10 MG tablet Take 10 mg by mouth daily as needed for allergies.     chlorzoxazone (PARAFON) 500 MG tablet Take by mouth 4 (four) times daily as needed for muscle spasms.     cholecalciferol (VITAMIN D) 1000 units tablet Take 1,000 Units by mouth daily. With calcium     Diclofenac Potassium 25 MG CAPS Take 25 mg by mouth daily as needed (for back and neck pain).     DULoxetine (CYMBALTA) 30 MG capsule Take 30 mg by mouth daily.     eletriptan (RELPAX) 40 MG tablet Take 40 mg by mouth every 2 (two) hours as needed for migraine or headache.      estradiol (ESTRACE) 1 MG tablet Take 1  tablet (1 mg total) by mouth daily. 90 tablet 4   Galcanezumab-gnlm 120 MG/ML SOSY Inject 120 mg into the skin every 30 (thirty) days.     Multiple Vitamins-Minerals (SUPER THERA VITE M) TABS Take 1 tablet by mouth daily.      OnabotulinumtoxinA (BOTOX IJ) Inject 1 each into the muscle every 3 (three) months.      Probiotic Product (PROBIOTIC-10) CAPS Take 1 capsule by mouth daily.      traZODone (DESYREL) 50 MG tablet Take 50 mg by mouth at bedtime.      No current facility-administered medications for this visit.    Family History  Problem Relation Age of Onset   Prostate cancer Father    Skin cancer Father    Cardiomyopathy Sister    Ovarian cysts Sister    Colon polyps  Neg Hx    Colon cancer Neg Hx    Esophageal cancer Neg Hx    Rectal cancer Neg Hx    Stomach cancer Neg Hx     ROS: Constitutional: negative Genitourinary:negative  Exam:   BP 112/78   Pulse 93   Ht 5' 2.75" (1.594 m)   Wt 167 lb 9.6 oz (76 kg)   LMP 07/03/2018 (Exact Date) Comment: Hysterectomy 07/25/2018  BMI 29.93 kg/m   Height: 5' 2.75" (159.4 cm)  General appearance: alert, cooperative and appears stated age Head: Normocephalic, without obvious abnormality, atraumatic Neck: no adenopathy, supple, symmetrical, trachea midline and thyroid normal to inspection and palpation Lungs: clear to auscultation bilaterally Breasts: normal appearance, no masses or tenderness Heart: regular rate and rhythm Abdomen: soft, non-tender; bowel sounds normal; no masses,  no organomegaly Extremities: extremities normal, atraumatic, no cyanosis or edema Skin: Skin color, texture, turgor normal. No rashes or lesions Lymph nodes: Cervical, supraclavicular, and axillary nodes normal. No abnormal inguinal nodes palpated Neurologic: Grossly normal   Pelvic: External genitalia:  no lesions              Urethra:  normal appearing urethra with no masses, tenderness or lesions              Bartholins and Skenes: normal                 Vagina: normal appearing vagina with normal color and no discharge, no lesions              Cervix: absent              Pap taken: No. Bimanual Exam:  Uterus:  uterus absent              Adnexa: no mass, fullness, tenderness               Rectovaginal: Confirms               Anus:  normal sphincter tone, no lesions  Chaperone, Octaviano Batty, CMA, was present for exam.  Assessment/Plan: 1. Well woman exam with routine gynecological exam - Pap smear neg with neg HR HPV - Mammogram 04/2022 - Colonoscopy 2021, follow up 10 years - Bone mineral density not indicated yet - lab work done with PCP - vaccines reviewed/updated.  Shingrix discussed.  2. Migraine  without aura and without status migrainosus, not intractable - followed by Dr. Beatris Si  3. Mixed hyperlipidemia - followed by Dr. Nancy Fetter  4. H/O: hysterectomy  5. Hormone replacement therapy - estradiol (ESTRACE) 1 MG tablet; Take 1 tablet (1 mg total) by mouth daily.  Dispense: 90 tablet; Refill:  4  6. Family history of cardiomyopathy - followed by Dr. Harrington Challenger  7.  Vaginal discharge - testing for yeast/BV obtained today

## 2022-10-23 LAB — CERVICOVAGINAL ANCILLARY ONLY
Bacterial Vaginitis (gardnerella): NEGATIVE
Candida Glabrata: NEGATIVE
Candida Vaginitis: POSITIVE — AB
Comment: NEGATIVE
Comment: NEGATIVE
Comment: NEGATIVE

## 2022-10-27 MED ORDER — FLUCONAZOLE 150 MG PO TABS: 150.0000 mg | ORAL_TABLET | Freq: Once | ORAL | 0 refills | Status: AC

## 2022-10-27 NOTE — Addendum Note (Signed)
Addended by: Megan Salon on: 10/27/2022 04:48 PM   Modules accepted: Orders

## 2022-11-09 DIAGNOSIS — M7751 Other enthesopathy of right foot: Secondary | ICD-10-CM | POA: Diagnosis not present

## 2022-11-09 DIAGNOSIS — M25571 Pain in right ankle and joints of right foot: Secondary | ICD-10-CM | POA: Diagnosis not present

## 2022-11-09 DIAGNOSIS — M12271 Villonodular synovitis (pigmented), right ankle and foot: Secondary | ICD-10-CM | POA: Diagnosis not present

## 2022-11-09 DIAGNOSIS — M21621 Bunionette of right foot: Secondary | ICD-10-CM | POA: Diagnosis not present

## 2022-11-16 DIAGNOSIS — M25571 Pain in right ankle and joints of right foot: Secondary | ICD-10-CM | POA: Diagnosis not present

## 2022-11-16 DIAGNOSIS — M65871 Other synovitis and tenosynovitis, right ankle and foot: Secondary | ICD-10-CM | POA: Diagnosis not present

## 2023-06-04 DIAGNOSIS — J0140 Acute pansinusitis, unspecified: Secondary | ICD-10-CM | POA: Diagnosis not present

## 2023-06-04 DIAGNOSIS — R051 Acute cough: Secondary | ICD-10-CM | POA: Diagnosis not present

## 2023-06-04 DIAGNOSIS — Z6828 Body mass index (BMI) 28.0-28.9, adult: Secondary | ICD-10-CM | POA: Diagnosis not present

## 2023-06-04 DIAGNOSIS — J029 Acute pharyngitis, unspecified: Secondary | ICD-10-CM | POA: Diagnosis not present

## 2023-06-10 DIAGNOSIS — J0140 Acute pansinusitis, unspecified: Secondary | ICD-10-CM | POA: Diagnosis not present

## 2023-06-18 DIAGNOSIS — J019 Acute sinusitis, unspecified: Secondary | ICD-10-CM | POA: Diagnosis not present

## 2023-06-18 DIAGNOSIS — H9201 Otalgia, right ear: Secondary | ICD-10-CM | POA: Diagnosis not present

## 2023-07-13 DIAGNOSIS — J329 Chronic sinusitis, unspecified: Secondary | ICD-10-CM | POA: Diagnosis not present

## 2023-07-13 DIAGNOSIS — H9011 Conductive hearing loss, unilateral, right ear, with unrestricted hearing on the contralateral side: Secondary | ICD-10-CM | POA: Diagnosis not present

## 2023-07-13 DIAGNOSIS — H9313 Tinnitus, bilateral: Secondary | ICD-10-CM | POA: Diagnosis not present

## 2023-07-13 DIAGNOSIS — H938X1 Other specified disorders of right ear: Secondary | ICD-10-CM | POA: Diagnosis not present

## 2023-07-13 DIAGNOSIS — G43909 Migraine, unspecified, not intractable, without status migrainosus: Secondary | ICD-10-CM | POA: Diagnosis not present

## 2023-07-13 DIAGNOSIS — H6123 Impacted cerumen, bilateral: Secondary | ICD-10-CM | POA: Diagnosis not present

## 2023-07-13 DIAGNOSIS — H9041 Sensorineural hearing loss, unilateral, right ear, with unrestricted hearing on the contralateral side: Secondary | ICD-10-CM | POA: Diagnosis not present

## 2023-07-13 DIAGNOSIS — H9190 Unspecified hearing loss, unspecified ear: Secondary | ICD-10-CM | POA: Diagnosis not present

## 2023-07-13 DIAGNOSIS — H9201 Otalgia, right ear: Secondary | ICD-10-CM | POA: Diagnosis not present

## 2023-07-13 DIAGNOSIS — H6991 Unspecified Eustachian tube disorder, right ear: Secondary | ICD-10-CM | POA: Diagnosis not present

## 2023-07-13 DIAGNOSIS — Z0110 Encounter for examination of ears and hearing without abnormal findings: Secondary | ICD-10-CM | POA: Diagnosis not present

## 2023-07-13 DIAGNOSIS — R42 Dizziness and giddiness: Secondary | ICD-10-CM | POA: Diagnosis not present

## 2023-07-15 DIAGNOSIS — H938X1 Other specified disorders of right ear: Secondary | ICD-10-CM | POA: Diagnosis not present

## 2023-07-15 DIAGNOSIS — H6991 Unspecified Eustachian tube disorder, right ear: Secondary | ICD-10-CM | POA: Diagnosis not present

## 2023-07-15 DIAGNOSIS — H9011 Conductive hearing loss, unilateral, right ear, with unrestricted hearing on the contralateral side: Secondary | ICD-10-CM | POA: Diagnosis not present

## 2023-09-04 DIAGNOSIS — H00012 Hordeolum externum right lower eyelid: Secondary | ICD-10-CM | POA: Diagnosis not present

## 2023-09-15 DIAGNOSIS — L718 Other rosacea: Secondary | ICD-10-CM | POA: Diagnosis not present

## 2023-09-15 DIAGNOSIS — L02425 Furuncle of right lower limb: Secondary | ICD-10-CM | POA: Diagnosis not present

## 2023-09-15 DIAGNOSIS — L821 Other seborrheic keratosis: Secondary | ICD-10-CM | POA: Diagnosis not present

## 2023-09-15 DIAGNOSIS — L814 Other melanin hyperpigmentation: Secondary | ICD-10-CM | POA: Diagnosis not present

## 2023-09-15 DIAGNOSIS — L918 Other hypertrophic disorders of the skin: Secondary | ICD-10-CM | POA: Diagnosis not present

## 2023-09-15 DIAGNOSIS — D225 Melanocytic nevi of trunk: Secondary | ICD-10-CM | POA: Diagnosis not present

## 2023-10-13 DIAGNOSIS — Z1231 Encounter for screening mammogram for malignant neoplasm of breast: Secondary | ICD-10-CM | POA: Diagnosis not present

## 2023-10-18 DIAGNOSIS — Z13220 Encounter for screening for lipoid disorders: Secondary | ICD-10-CM | POA: Diagnosis not present

## 2023-10-18 DIAGNOSIS — Z0000 Encounter for general adult medical examination without abnormal findings: Secondary | ICD-10-CM | POA: Diagnosis not present

## 2023-10-22 DIAGNOSIS — Z113 Encounter for screening for infections with a predominantly sexual mode of transmission: Secondary | ICD-10-CM | POA: Diagnosis not present

## 2023-10-27 ENCOUNTER — Encounter (HOSPITAL_BASED_OUTPATIENT_CLINIC_OR_DEPARTMENT_OTHER): Payer: Self-pay | Admitting: *Deleted

## 2023-10-29 ENCOUNTER — Other Ambulatory Visit (HOSPITAL_BASED_OUTPATIENT_CLINIC_OR_DEPARTMENT_OTHER): Payer: Self-pay | Admitting: Obstetrics & Gynecology

## 2023-10-29 DIAGNOSIS — Z79890 Hormone replacement therapy (postmenopausal): Secondary | ICD-10-CM

## 2023-11-01 NOTE — Telephone Encounter (Signed)
Attempted to call pt in regards to rx refill on estrace no answer I left vm

## 2023-11-10 ENCOUNTER — Encounter (HOSPITAL_BASED_OUTPATIENT_CLINIC_OR_DEPARTMENT_OTHER): Payer: Self-pay | Admitting: Obstetrics & Gynecology

## 2023-11-10 ENCOUNTER — Ambulatory Visit (INDEPENDENT_AMBULATORY_CARE_PROVIDER_SITE_OTHER): Payer: 59 | Admitting: Obstetrics & Gynecology

## 2023-11-10 ENCOUNTER — Other Ambulatory Visit (HOSPITAL_COMMUNITY)
Admission: RE | Admit: 2023-11-10 | Discharge: 2023-11-10 | Disposition: A | Discharge status: Home / Self Care | Payer: 59 | Source: Ambulatory Visit | Attending: Obstetrics & Gynecology | Admitting: Obstetrics & Gynecology

## 2023-11-10 VITALS — BP 117/74 | HR 90 | Ht 63.5 in | Wt 180.2 lb

## 2023-11-10 DIAGNOSIS — Z79890 Hormone replacement therapy (postmenopausal): Secondary | ICD-10-CM | POA: Diagnosis not present

## 2023-11-10 DIAGNOSIS — Z113 Encounter for screening for infections with a predominantly sexual mode of transmission: Secondary | ICD-10-CM

## 2023-11-10 DIAGNOSIS — G43009 Migraine without aura, not intractable, without status migrainosus: Secondary | ICD-10-CM | POA: Diagnosis not present

## 2023-11-10 DIAGNOSIS — Z01419 Encounter for gynecological examination (general) (routine) without abnormal findings: Secondary | ICD-10-CM

## 2023-11-10 MED ORDER — ESTRADIOL 1 MG PO TABS: 1.0000 mg | ORAL_TABLET | Freq: Every day | ORAL | 4 refills | Status: DC

## 2023-11-10 NOTE — Progress Notes (Deleted)
52 y.o. G61P2004 Divorced White or Caucasian female here for annual exam.    Patient's last menstrual period was 07/03/2018 (exact date).          Sexually active: {yes no:314532}  The current method of family planning is {contraception:315051}.     The pregnancy intention screening data noted above was reviewed. Potential methods of contraception were discussed. The patient elected to proceed with No data recorded.  Exercising: {yes no:314532}  {types:19826} Smoker:  {YES [id number]  Health Maintenance: Pap:  10/13/2021 Negative History of abnormal Pap:  {YES NO:22349} MMG:  10/13/2023 Negative Colonoscopy:  12/18/2020 BMD:   *** Screening Labs: ***   reports that she has never smoked. She has never used smokeless tobacco. She reports current alcohol use of about 2.0 standard drinks of alcohol per week. She reports that she does not use drugs.  Past Medical History:  Diagnosis Date   Depression    Elevated blood-pressure reading without diagnosis of hypertension 03/13/2017   Exercise-induced asthma    Childhood   Female stress incontinence 05/02/2018   GERD (gastroesophageal reflux disease)    history after decadron reaction   Infertility, female    Insomnia    Migraine without aura and without status migrainosus, not intractable 03/13/2017   Mixed hyperlipidemia 03/13/2017   PCOS (polycystic ovarian syndrome)    Seasonal allergies    Sleep apnea    does not use CPAP    Past Surgical History:  Procedure Laterality Date   BLADDER SUSPENSION     CHOLECYSTECTOMY, LAPAROSCOPIC  11/13/2020   CYSTOSCOPY N/A 07/25/2018   Procedure: CYSTOSCOPY possible cysto;  Surgeon: Jerene Bears, MD;  Location: [facility];  Service: Gynecology;  Laterality: N/A;  possible cysto   FOOT SURGERY Right 2020   TOTAL LAPAROSCOPIC HYSTERECTOMY WITH SALPINGECTOMY Bilateral 07/25/2018   Procedure: TOTAL LAPAROSCOPIC HYSTERECTOMY WITH SALPINGECTOMY possible BSO;  Surgeon: Jerene Bears,  MD;  Location: [facility];  Service: Gynecology;  Laterality: Bilateral;  possible BSO   URETHRAL SLING  09/14/2012   SPARC   WISDOM TOOTH EXTRACTION      Current Outpatient Medications  Medication Sig Dispense Refill   Atogepant (QULIPTA) 60 MG TABS Take 60 mg by mouth daily.     B Complex Vitamins (B COMPLEX PO) Take 1 capsule by mouth daily.     cetirizine (ZYRTEC) 10 MG tablet Take 10 mg by mouth daily as needed for allergies.     chlorzoxazone (PARAFON) 500 MG tablet Take by mouth 4 (four) times daily as needed for muscle spasms.     cholecalciferol (VITAMIN D) 1000 units tablet Take 1,000 Units by mouth daily. With calcium     Diclofenac Potassium 25 MG CAPS Take 25 mg by mouth daily as needed (for back and neck pain).     DULoxetine (CYMBALTA) 30 MG capsule Take 30 mg by mouth daily.     eletriptan (RELPAX) 40 MG tablet Take 40 mg by mouth every 2 (two) hours as needed for migraine or headache.      estradiol (ESTRACE) 1 MG tablet TAKE 1 TABLET BY MOUTH EVERY DAY 90 tablet 0   Galcanezumab-gnlm 120 MG/ML SOSY Inject 120 mg into the skin every 30 (thirty) days.     Multiple Vitamins-Minerals (SUPER THERA VITE M) TABS Take 1 tablet by mouth daily.      OnabotulinumtoxinA (BOTOX IJ) Inject 1 each into the muscle every 3 (three) months.      Probiotic Product (PROBIOTIC-10) CAPS  Take 1 capsule by mouth daily.      traZODone (DESYREL) 50 MG tablet Take 50 mg by mouth at bedtime.      No current facility-administered medications for this visit.    Family History  Problem Relation Age of Onset   Prostate cancer Father    Skin cancer Father    Cardiomyopathy Sister    Ovarian cysts Sister    Colon polyps Neg Hx    Colon cancer Neg Hx    Esophageal cancer Neg Hx    Rectal cancer Neg Hx    Stomach cancer Neg Hx     ROS: Constitutional: {Findings; ROS constitutional:30497::"negative"} Genitourinary:{Findings; ROS genitourinary:19593::"negative"}  Exam:   BP  117/74 (BP Location: Right Arm, Patient Position: Sitting, Cuff Size: Normal)   Pulse 90   Ht 5' 3.5" (1.613 m)   Wt 180 lb 3.2 oz (81.7 kg)   LMP 07/03/2018 (Exact Date) Comment: Hysterectomy 07/25/2018  BMI 31.42 kg/m   Height: 5' 3.5" (161.3 cm)  General appearance: alert, cooperative and appears stated age Head: Normocephalic, without obvious abnormality, atraumatic Neck: no adenopathy, supple, symmetrical, trachea midline and thyroid {EXAM; THYROID:18604} Lungs: clear to auscultation bilaterally Breasts: {Exam; breast:13139::"normal appearance, no masses or tenderness"} Heart: regular rate and rhythm Abdomen: soft, non-tender; bowel sounds normal; no masses,  no organomegaly Extremities: extremities normal, atraumatic, no cyanosis or edema Skin: Skin color, texture, turgor normal. No rashes or lesions Lymph nodes: Cervical, supraclavicular, and axillary nodes normal. No abnormal inguinal nodes palpated Neurologic: Grossly normal   Pelvic: External genitalia:  no lesions              Urethra:  normal appearing urethra with no masses, tenderness or lesions              Bartholins and Skenes: normal                 Vagina: normal appearing vagina with normal color and no discharge, no lesions              Cervix: {exam; cervix:14595}              Pap taken: {yes no:314532} Bimanual Exam:  Uterus:  {exam; uterus:12215}              Adnexa: {exam; adnexa:12223}               Rectovaginal: Confirms               Anus:  normal sphincter tone, no lesions  Chaperone, ***, CMA, was present for exam.  Assessment/Plan:

## 2023-11-10 NOTE — Progress Notes (Signed)
HPI  52 y.o. G27P2004 Divorced White or Caucasian female here for annual exam.  Doing well.  Has new partner.  Reports issue recently with a broken condom.  Requests STI testing again today.  Had blood work and doesn't feel that is needed.  Last pap was 2022 with neg HR HPV.  Discussed repeating next year with repeat HR HPV testing.    Denies vaginal bleeding.    She is on estradiol and doing well.  Takes half in morning and half at night.    Headaches are under good control.  No auras.    Kids are going well.  Discussed her twins college plans.  [employer] is their desired school.  Haven't heard from there yet.        Patient's last menstrual period was 07/03/2018 (exact date).           Health Maintenance: Pap:  09/2021 History of abnormal Pap:  no MMG:  09/2023 Colonoscopy:  2021, follow up 10 years BMD:   not indicated yet Screening Labs: done with Dr. Wynelle Link   reports that she has never smoked. She has never used smokeless tobacco. She reports current alcohol use of about 2.0 standard drinks of alcohol per week. She reports that she does not use drugs.  Past Medical History:  Diagnosis Date   Depression    Elevated blood-pressure reading without diagnosis of hypertension 03/13/2017   Exercise-induced asthma    Childhood   Female stress incontinence 05/02/2018   GERD (gastroesophageal reflux disease)    history after decadron reaction   Infertility, female    Insomnia    Migraine without aura and without status migrainosus, not intractable 03/13/2017   Mixed hyperlipidemia 03/13/2017   PCOS (polycystic ovarian syndrome)    Seasonal allergies    Sleep apnea    does not use CPAP    Past Surgical History:  Procedure Laterality Date   BLADDER SUSPENSION     CHOLECYSTECTOMY, LAPAROSCOPIC  11/13/2020   CYSTOSCOPY N/A 07/25/2018   Procedure: CYSTOSCOPY possible cysto;  Surgeon: Jerene Bears, MD;  Location: [facility];  Service: Gynecology;  Laterality: N/A;   possible cysto   FOOT SURGERY Right 2020   TOTAL LAPAROSCOPIC HYSTERECTOMY WITH SALPINGECTOMY Bilateral 07/25/2018   Procedure: TOTAL LAPAROSCOPIC HYSTERECTOMY WITH SALPINGECTOMY possible BSO;  Surgeon: Jerene Bears, MD;  Location: [facility];  Service: Gynecology;  Laterality: Bilateral;  possible BSO   URETHRAL SLING  09/14/2012   SPARC   WISDOM TOOTH EXTRACTION      Current Outpatient Medications  Medication Sig Dispense Refill   Atogepant (QULIPTA) 60 MG TABS Take 60 mg by mouth daily.     B Complex Vitamins (B COMPLEX PO) Take 1 capsule by mouth daily.     cetirizine (ZYRTEC) 10 MG tablet Take 10 mg by mouth daily as needed for allergies.     chlorzoxazone (PARAFON) 500 MG tablet Take by mouth 4 (four) times daily as needed for muscle spasms.     cholecalciferol (VITAMIN D) 1000 units tablet Take 1,000 Units by mouth daily. With calcium     Diclofenac Potassium 25 MG CAPS Take 25 mg by mouth daily as needed (for back and neck pain).     DULoxetine (CYMBALTA) 30 MG capsule Take 30 mg by mouth daily.     eletriptan (RELPAX) 40 MG tablet Take 40 mg by mouth every 2 (two) hours as needed for migraine or headache.      estradiol (ESTRACE)  1 MG tablet TAKE 1 TABLET BY MOUTH EVERY DAY 90 tablet 0   Galcanezumab-gnlm 120 MG/ML SOSY Inject 120 mg into the skin every 30 (thirty) days.     Multiple Vitamins-Minerals (SUPER THERA VITE M) TABS Take 1 tablet by mouth daily.      OnabotulinumtoxinA (BOTOX IJ) Inject 1 each into the muscle every 3 (three) months.      Probiotic Product (PROBIOTIC-10) CAPS Take 1 capsule by mouth daily.      traZODone (DESYREL) 50 MG tablet Take 50 mg by mouth at bedtime.      No current facility-administered medications for this visit.    Family History  Problem Relation Age of Onset   Prostate cancer Father    Skin cancer Father    Cardiomyopathy Sister    Ovarian cysts Sister    Colon polyps Neg Hx    Colon cancer Neg Hx    Esophageal  cancer Neg Hx    Rectal cancer Neg Hx    Stomach cancer Neg Hx     ROS: Constitutional: negative Genitourinary:negative  Exam:   BP 117/74 (BP Location: Right Arm, Patient Position: Sitting, Cuff Size: Normal)   Pulse 90   Ht 5' 3.5" (1.613 m)   Wt 180 lb 3.2 oz (81.7 kg)   LMP 07/03/2018 (Exact Date) Comment: Hysterectomy 07/25/2018  BMI 31.42 kg/m   Height: 5' 3.5" (161.3 cm)  General appearance: alert, cooperative and appears stated age Head: Normocephalic, without obvious abnormality, atraumatic Neck: no adenopathy, supple, symmetrical, trachea midline and thyroid normal to inspection and palpation Lungs: clear to auscultation bilaterally Breasts: normal appearance, no masses or tenderness Heart: regular rate and rhythm Abdomen: soft, non-tender; bowel sounds normal; no masses,  no organomegaly Extremities: extremities normal, atraumatic, no cyanosis or edema Skin: Skin color, texture, turgor normal. No rashes or lesions Lymph nodes: Cervical, supraclavicular, and axillary nodes normal. No abnormal inguinal nodes palpated Neurologic: Grossly normal   Pelvic: External genitalia:  no lesions              Urethra:  normal appearing urethra with no masses, tenderness or lesions              Bartholins and Skenes: normal                 Vagina: normal appearing vagina with normal color and no discharge, no lesions              Cervix: no lesions              Pap taken: No. Bimanual Exam:  Uterus:  uterus absent              Adnexa: no mass, fullness, tenderness               Rectovaginal: Confirms               Anus:  normal sphincter tone, no lesions  Chaperone, Ina Homes, CMA, was present for exam.  Assessment/Plan: 1. Well woman exam with routine gynecological exam - Pap smear not indicated - Mammogram 10/27/2023 - Colonoscopy 2021, follow up 10 years - Bone mineral density not indicated - lab work done with PCP, Dr. Wynelle Link - vaccines reviewed/updated  2.  Screening examination for STD (sexually transmitted disease) - Cervicovaginal ancillary only( [place])  3. Hormone replacement therapy - estradiol (ESTRACE) 1 MG tablet; Take 1 tablet (1 mg total) by mouth daily.  Dispense: 90 tablet; Refill: 4  4. Migraine without  aura and without status migrainosus, not intractable - followed by neurology.  Stable.

## 2023-11-11 ENCOUNTER — Ambulatory Visit (HOSPITAL_BASED_OUTPATIENT_CLINIC_OR_DEPARTMENT_OTHER): Payer: BC Managed Care – PPO | Admitting: Obstetrics & Gynecology

## 2023-11-11 LAB — CERVICOVAGINAL ANCILLARY ONLY
Chlamydia: NEGATIVE
Comment: NEGATIVE
Comment: NEGATIVE
Comment: NORMAL
Neisseria Gonorrhea: NEGATIVE
Trichomonas: NEGATIVE

## 2023-11-23 DIAGNOSIS — Z9622 Myringotomy tube(s) status: Secondary | ICD-10-CM | POA: Diagnosis not present

## 2023-11-23 DIAGNOSIS — H6991 Unspecified Eustachian tube disorder, right ear: Secondary | ICD-10-CM | POA: Diagnosis not present

## 2023-11-23 DIAGNOSIS — H938X1 Other specified disorders of right ear: Secondary | ICD-10-CM | POA: Diagnosis not present

## 2023-11-23 DIAGNOSIS — H9011 Conductive hearing loss, unilateral, right ear, with unrestricted hearing on the contralateral side: Secondary | ICD-10-CM | POA: Diagnosis not present

## 2024-01-23 ENCOUNTER — Other Ambulatory Visit (HOSPITAL_BASED_OUTPATIENT_CLINIC_OR_DEPARTMENT_OTHER): Payer: Self-pay | Admitting: Obstetrics & Gynecology

## 2024-01-23 DIAGNOSIS — Z79890 Hormone replacement therapy (postmenopausal): Secondary | ICD-10-CM

## 2024-02-01 DIAGNOSIS — Z03818 Encounter for observation for suspected exposure to other biological agents ruled out: Secondary | ICD-10-CM | POA: Diagnosis not present

## 2024-02-01 DIAGNOSIS — K529 Noninfective gastroenteritis and colitis, unspecified: Secondary | ICD-10-CM | POA: Diagnosis not present

## 2024-02-01 DIAGNOSIS — N3001 Acute cystitis with hematuria: Secondary | ICD-10-CM | POA: Diagnosis not present

## 2024-02-03 DIAGNOSIS — H938X1 Other specified disorders of right ear: Secondary | ICD-10-CM | POA: Diagnosis not present

## 2024-02-03 DIAGNOSIS — H9313 Tinnitus, bilateral: Secondary | ICD-10-CM | POA: Diagnosis not present

## 2024-02-03 DIAGNOSIS — H9011 Conductive hearing loss, unilateral, right ear, with unrestricted hearing on the contralateral side: Secondary | ICD-10-CM | POA: Diagnosis not present

## 2024-02-03 DIAGNOSIS — H9193 Unspecified hearing loss, bilateral: Secondary | ICD-10-CM | POA: Diagnosis not present

## 2024-02-03 DIAGNOSIS — Z9622 Myringotomy tube(s) status: Secondary | ICD-10-CM | POA: Diagnosis not present

## 2024-02-15 DIAGNOSIS — H9011 Conductive hearing loss, unilateral, right ear, with unrestricted hearing on the contralateral side: Secondary | ICD-10-CM | POA: Diagnosis not present

## 2024-02-21 ENCOUNTER — Other Ambulatory Visit (HOSPITAL_COMMUNITY)
Admission: RE | Admit: 2024-02-21 | Discharge: 2024-02-21 | Disposition: A | Discharge status: Home / Self Care | Source: Ambulatory Visit | Attending: Obstetrics & Gynecology | Admitting: Obstetrics & Gynecology

## 2024-02-21 ENCOUNTER — Ambulatory Visit (INDEPENDENT_AMBULATORY_CARE_PROVIDER_SITE_OTHER): Admitting: *Deleted

## 2024-02-21 VITALS — BP 143/87 | HR 75 | Ht 64.0 in | Wt 180.4 lb

## 2024-02-21 DIAGNOSIS — B3731 Acute candidiasis of vulva and vagina: Secondary | ICD-10-CM

## 2024-02-21 DIAGNOSIS — N898 Other specified noninflammatory disorders of vagina: Secondary | ICD-10-CM | POA: Insufficient documentation

## 2024-02-21 MED ORDER — FLUCONAZOLE 150 MG PO TABS: 150.0000 mg | ORAL_TABLET | Freq: Once | ORAL | 0 refills | Status: AC

## 2024-02-21 NOTE — Progress Notes (Signed)
 NURSE VISIT- VAGINITIS/STD/POC  SUBJECTIVE:  Anna Thomas is a 53 y.o. [id number] GYN patientfemale here for a vaginal swab for vaginitis screening.  She reports the following symptoms: discharge described as white and curd-like for af few days. Denies abnormal vaginal bleeding, significant pelvic pain, fever, or UTI symptoms.  OBJECTIVE:  BP (!) 143/87   Pulse 75   Ht 5\' 4"  (1.626 m)   Wt 180 lb 6.4 oz (81.8 kg)   LMP 07/03/2018 (Exact Date) Comment: Hysterectomy 07/25/2018  BMI 30.97 kg/m   Appears well, in no apparent distress  ASSESSMENT: Vaginal swab for vaginitis screening  PLAN: Self-collected vaginal probe for Bacterial Vaginosis, Yeast sent to lab Treatment:Diflucan sent to pharmacy per protocol Follow-up as needed if symptoms persist/worsen, or new symptoms develop

## 2024-02-22 LAB — CERVICOVAGINAL ANCILLARY ONLY
Bacterial Vaginitis (gardnerella): NEGATIVE
Candida Glabrata: NEGATIVE
Candida Vaginitis: POSITIVE — AB
Comment: NEGATIVE
Comment: NEGATIVE
Comment: NEGATIVE

## 2024-02-23 ENCOUNTER — Encounter (HOSPITAL_BASED_OUTPATIENT_CLINIC_OR_DEPARTMENT_OTHER): Payer: Self-pay | Admitting: Obstetrics & Gynecology

## 2024-02-23 MED ORDER — FLUCONAZOLE 150 MG PO TABS: 150.0000 mg | ORAL_TABLET | Freq: Once | ORAL | 0 refills | Status: AC

## 2024-02-23 NOTE — Addendum Note (Signed)
 Addended by: Jerene Bears on: 02/23/2024 06:25 AM   Modules accepted: Orders

## 2024-03-13 DIAGNOSIS — D473 Essential (hemorrhagic) thrombocythemia: Secondary | ICD-10-CM | POA: Diagnosis not present

## 2024-03-21 DIAGNOSIS — G4733 Obstructive sleep apnea (adult) (pediatric): Secondary | ICD-10-CM | POA: Diagnosis not present

## 2024-03-21 DIAGNOSIS — G4763 Sleep related bruxism: Secondary | ICD-10-CM | POA: Diagnosis not present

## 2024-03-21 DIAGNOSIS — G44329 Chronic post-traumatic headache, not intractable: Secondary | ICD-10-CM | POA: Diagnosis not present

## 2024-03-21 DIAGNOSIS — G43909 Migraine, unspecified, not intractable, without status migrainosus: Secondary | ICD-10-CM | POA: Diagnosis not present

## 2024-03-21 DIAGNOSIS — H903 Sensorineural hearing loss, bilateral: Secondary | ICD-10-CM | POA: Diagnosis not present

## 2024-03-27 DIAGNOSIS — H9011 Conductive hearing loss, unilateral, right ear, with unrestricted hearing on the contralateral side: Secondary | ICD-10-CM | POA: Diagnosis not present

## 2024-04-20 ENCOUNTER — Telehealth: Payer: Self-pay

## 2024-04-20 NOTE — Progress Notes (Signed)
  [facility] CONSULT NOTE  Patient Care Team: Sun, Vyvyan, MD as PCP - General (Family Medicine)  CHIEF COMPLAINTS/PURPOSE OF CONSULTATION:  Elevated platelet count.  ASSESSMENT & PLAN:  No problem-specific Assessment & Plan notes found for this encounter.  Orders Placed This Encounter  Procedures   CBC with Differential/Platelet    Standing Status:   Standing    Number of Occurrences:   22    Expiration Date:   04/21/2025   Iron and Iron Binding Capacity ([facility] only)    Standing Status:   Future    Number of Occurrences:   1    Expiration Date:   04/21/2025   Ferritin    Standing Status:   Future    Number of Occurrences:   1    Expiration Date:   04/21/2025   ANA, IFA (with reflex)    Standing Status:   Future    Number of Occurrences:   1    Expiration Date:   04/21/2025   TSH    Standing Status:   Standing    Number of Occurrences:   22    Expiration Date:   04/21/2025   Assessment and Plan Assessment & Plan Thrombocytosis 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 No evidence of essential thrombocytosis or bone marrow disorder. We have reviewed labs over the past 8 yrs, there was only one lab when she had normal platelet count. - Possible iron deficiency or autoimmune contribution/chronic pain  - Check iron stores. - Evaluate for autoimmune diseases. - Advise baby aspirin if platelet count increases significantly. - Refer back if platelet count exceeds 600,000.  Chronic migraine Chronic migraines secondary to work-related injury, contributing to chronic pain and possible secondary thrombocytosis. - Continue Ubrelvy and Relpax as needed.  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 - Scheduled for surgery in July to address scar tissue and potential mastoiditis.  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    HISTORY OF PRESENTING ILLNESS:  Anna Thomas 53 y.o. female is here because of thrombocytosis.  Discussed the use of AI scribe software for clinical note transcription with the patient, who gave verbal consent to proceed.  History of Present Illness Anna Thomas is a 53 year old female with thrombocytosis who presents for evaluation of increased platelet count. She was referred by her primary doctor for evaluation of her elevated platelet count.  She has a history of elevated platelet counts, with records showing counts of 426,000, 484,000, and 465,000 over the past five years. No major bleeding complications reported, but she experiences easy bruising. She does not recall discussing this issue previously, except with her current doctor.  She experiences chronic migraines due to a work-related injury seven years ago, contributing to chronic pain. She takes [place] and Relpax as needed for migraine management.  She reports frequent falls, which she attributes to an ear issue affecting her balance. She is scheduled for ear surgery in July to address scar tissue in her middle ear and a brain fluid leak into her ear cavity. This condition has been impacting her balance.  Her family history is significant for hypertrophic cardiomyopathy, as her sister has the condition. She undergoes regular echocardiograms every three years, which have been normal.  In terms of social history, she does not smoke and drinks alcohol occasionally, consuming a couple of glasses per week.  All  other systems were reviewed with the patient and are negative.  MEDICAL HISTORY:  Past Medical History:  Diagnosis Date   Depression    Elevated blood-pressure reading without diagnosis of hypertension 03/13/2017   Exercise-induced asthma    Childhood   Female stress incontinence 05/02/2018   GERD (gastroesophageal reflux disease)    history after decadron reaction   Infertility, female     Insomnia    Migraine without aura and without status migrainosus, not intractable 03/13/2017   Mixed hyperlipidemia 03/13/2017   PCOS (polycystic ovarian syndrome)    Seasonal allergies    Sleep apnea    does not use CPAP    SURGICAL HISTORY: Past Surgical History:  Procedure Laterality Date   BLADDER SUSPENSION     CHOLECYSTECTOMY, LAPAROSCOPIC  11/13/2020   CYSTOSCOPY N/A 07/25/2018   Procedure: CYSTOSCOPY possible cysto;  Surgeon: Lillian Rein, MD;  Location: [facility];  Service: Gynecology;  Laterality: N/A;  possible cysto   FOOT SURGERY Right 2020   TOTAL LAPAROSCOPIC HYSTERECTOMY WITH SALPINGECTOMY Bilateral 07/25/2018   Procedure: TOTAL LAPAROSCOPIC HYSTERECTOMY WITH SALPINGECTOMY possible BSO;  Surgeon: Lillian Rein, MD;  Location: [facility];  Service: Gynecology;  Laterality: Bilateral;  possible BSO   URETHRAL SLING  09/14/2012   SPARC   WISDOM TOOTH EXTRACTION      SOCIAL HISTORY: Social History   Socioeconomic History   Marital status: Divorced    Spouse name: Not on file   Number of children: Not on file   Years of education: Not on file   Highest education level: Not on file  Occupational History   Not on file  Tobacco Use   Smoking status: Never   Smokeless tobacco: Never  Vaping Use   Vaping status: Never Used  Substance and Sexual Activity   Alcohol use: Yes    Alcohol/week: 2.0 standard drinks of alcohol    Types: 2 Glasses of wine per week    Comment: occ   Drug use: Never   Sexual activity: Yes    Birth control/protection: Surgical    Comment: hysterectomy  Other Topics Concern   Not on file  Social History Narrative   Not on file   Social Drivers of Health   Financial Resource Strain: Not on file  Food Insecurity: Low Risk  (11/23/2023)   Received from [facility]   Hunger Vital Sign    Worried About Running Out of Food in the Last Year: Never true    Ran Out of Food in the Last Year: Never true   Transportation Needs: No Transportation Needs (11/23/2023)   Received from [employer]    In the past 12 months, has lack of reliable transportation kept you from medical appointments, meetings, work or from getting things needed for daily living? : No  Physical Activity: Not on file  Stress: Not on file  Social Connections: Not on file  Intimate Partner Violence: Not on file    FAMILY HISTORY: Family History  Problem Relation Age of Onset   Prostate cancer Father    Skin cancer Father    Cardiomyopathy Sister    Ovarian cysts Sister    Colon polyps Neg Hx    Colon cancer Neg Hx    Esophageal cancer Neg Hx    Rectal cancer Neg Hx    Stomach cancer Neg Hx     ALLERGIES:  is allergic to dexamethasone and pseudoephedrine hcl.  MEDICATIONS:  Current Outpatient Medications  Medication Sig Dispense Refill   Ubrogepant (UBRELVY) 100 MG TABS Take by mouth as needed.     Atogepant (QULIPTA) 60 MG TABS Take 60 mg by mouth daily.     B Complex Vitamins (B COMPLEX PO) Take 1 capsule by mouth daily.     cetirizine (ZYRTEC) 10 MG tablet Take 10 mg by mouth daily as needed for allergies.     chlorzoxazone (PARAFON) 500 MG tablet Take by mouth 4 (four) times daily as needed for muscle spasms.     cholecalciferol (VITAMIN D) 1000 units tablet Take 1,000 Units by mouth daily. With calcium     Diclofenac Potassium 25 MG CAPS Take 25 mg by mouth daily as needed (for back and neck pain).     DULoxetine  (CYMBALTA ) 30 MG capsule Take 30 mg by mouth daily.     eletriptan (RELPAX) 40 MG tablet Take 40 mg by mouth every 2 (two) hours as needed for migraine or headache.      estradiol  (ESTRACE ) 1 MG tablet TAKE 1 TABLET BY MOUTH EVERY DAY 90 tablet 5   Galcanezumab-gnlm 120 MG/ML SOSY Inject 120 mg into the skin every 30 (thirty) days.     Multiple Vitamins-Minerals (SUPER THERA VITE M) TABS Take 1 tablet by mouth daily.      OnabotulinumtoxinA (BOTOX IJ) Inject 1 each into the  muscle every 3 (three) months.      Probiotic Product (PROBIOTIC-10) CAPS Take 1 capsule by mouth daily.      traZODone (DESYREL) 50 MG tablet Take 50 mg by mouth at bedtime.      No current facility-administered medications for this visit.     PHYSICAL EXAMINATION: ECOG PERFORMANCE STATUS: 0 - Asymptomatic  Vitals:   04/21/24 0954  BP: (!) 129/94  Pulse: 87  Resp: 18  Temp: 98.3 F (36.8 C)  SpO2: 98%   Filed Weights   04/21/24 0954  Weight: 181 lb 12.8 oz (82.5 kg)    GENERAL:alert, no distress and comfortable SKIN: skin color, texture, turgor are normal, no rashes or significant lesions EYES: normal, conjunctiva are pink and non-injected, sclera clear OROPHARYNX:no exudate, no erythema and lips, buccal mucosa, and tongue normal  NECK: supple, thyroid  normal size, non-tender, without nodularity LYMPH:  no palpable lymphadenopathy in the cervical, axillary  LUNGS: clear to auscultation and percussion with normal breathing effort HEART: regular rate & rhythm and no murmurs and no lower extremity edema ABDOMEN:abdomen soft, non-tender and normal bowel sounds Musculoskeletal:no cyanosis of digits and no clubbing  PSYCH: alert & oriented x 3 with fluent speech NEURO: no focal motor/sensory deficits  LABORATORY DATA:  I have reviewed the data as listed Lab Results  Component Value Date   WBC 6.9 04/21/2024   HGB 13.5 04/21/2024   HCT 40.1 04/21/2024   MCV 93.7 04/21/2024   PLT 396 04/21/2024     Chemistry      Component Value Date/Time   NA 139 09/10/2020 1930   K 4.3 09/10/2020 1930   CL 100 09/10/2020 1930   CO2 29 09/10/2020 1930   BUN 19 09/10/2020 1930   CREATININE 0.80 09/10/2020 1930      Component Value Date/Time   CALCIUM 9.8 09/10/2020 1930   ALKPHOS 76 09/10/2020 1930   AST 21 09/10/2020 1930   ALT 21 09/10/2020 1930   BILITOT 0.6 09/10/2020 1930       RADIOGRAPHIC STUDIES: I have personally reviewed the radiological images as listed and  agreed with the findings in the  report. No results found.  All questions were answered. The patient knows to call the clinic with any problems, questions or concerns. I spent 45 minutes in the care of this patient including H and P, review of records, counseling and coordination of care.     Murleen Arms, MD 04/21/2024 11:25 AM

## 2024-04-20 NOTE — Telephone Encounter (Signed)
 Spoke with patient and confirmed appointment on 04/21/24

## 2024-04-21 ENCOUNTER — Inpatient Hospital Stay: Attending: Hematology and Oncology

## 2024-04-21 ENCOUNTER — Inpatient Hospital Stay: Admitting: Hematology and Oncology

## 2024-04-21 ENCOUNTER — Encounter: Payer: Self-pay | Admitting: Hematology and Oncology

## 2024-04-21 VITALS — BP 129/94 | HR 87 | Temp 98.3°F | Resp 18 | Ht 64.0 in | Wt 181.8 lb

## 2024-04-21 DIAGNOSIS — H6690 Otitis media, unspecified, unspecified ear: Secondary | ICD-10-CM

## 2024-04-21 DIAGNOSIS — G43009 Migraine without aura, not intractable, without status migrainosus: Secondary | ICD-10-CM | POA: Diagnosis not present

## 2024-04-21 DIAGNOSIS — D75839 Thrombocytosis, unspecified: Secondary | ICD-10-CM | POA: Insufficient documentation

## 2024-04-21 LAB — CBC WITH DIFFERENTIAL/PLATELET
Abs Immature Granulocytes: 0.01 10*3/uL (ref 0.00–0.07)
Basophils Absolute: 0.1 10*3/uL (ref 0.0–0.1)
Basophils Relative: 1 %
Eosinophils Absolute: 0.1 10*3/uL (ref 0.0–0.5)
Eosinophils Relative: 1 %
HCT: 40.1 % (ref 36.0–46.0)
Hemoglobin: 13.5 g/dL (ref 12.0–15.0)
Immature Granulocytes: 0 %
Lymphocytes Relative: 25 %
Lymphs Abs: 1.7 10*3/uL (ref 0.7–4.0)
MCH: 31.5 pg (ref 26.0–34.0)
MCHC: 33.7 g/dL (ref 30.0–36.0)
MCV: 93.7 fL (ref 80.0–100.0)
Monocytes Absolute: 0.5 10*3/uL (ref 0.1–1.0)
Monocytes Relative: 7 %
Neutro Abs: 4.5 10*3/uL (ref 1.7–7.7)
Neutrophils Relative %: 66 %
Platelets: 396 10*3/uL (ref 150–400)
RBC: 4.28 MIL/uL (ref 3.87–5.11)
RDW: 12.5 % (ref 11.5–15.5)
WBC: 6.9 10*3/uL (ref 4.0–10.5)
nRBC: 0 % (ref 0.0–0.2)

## 2024-04-21 LAB — TSH: TSH: 1.78 u[IU]/mL (ref 0.350–4.500)

## 2024-04-21 LAB — FERRITIN: Ferritin: 114 ng/mL (ref 11–307)

## 2024-04-21 LAB — IRON AND IRON BINDING CAPACITY (CC-WL,HP ONLY)
Iron: 130 ug/dL (ref 28–170)
Saturation Ratios: 34 % — ABNORMAL HIGH (ref 10.4–31.8)
TIBC: 379 ug/dL (ref 250–450)
UIBC: 249 ug/dL (ref 148–442)

## 2024-04-25 DIAGNOSIS — G4733 Obstructive sleep apnea (adult) (pediatric): Secondary | ICD-10-CM | POA: Diagnosis not present

## 2024-04-26 ENCOUNTER — Inpatient Hospital Stay (HOSPITAL_BASED_OUTPATIENT_CLINIC_OR_DEPARTMENT_OTHER): Admitting: Hematology and Oncology

## 2024-04-26 DIAGNOSIS — D75839 Thrombocytosis, unspecified: Secondary | ICD-10-CM | POA: Diagnosis not present

## 2024-04-26 NOTE — Progress Notes (Signed)
 [facility] CONSULT NOTE  Patient Care Team: Sun, Vyvyan, MD as PCP - General (Family Medicine)  CHIEF COMPLAINTS/PURPOSE OF CONSULTATION:  Elevated platelet count.  ASSESSMENT & PLAN:   This is a very pleasant pt referred to hematology for evaluation of thrombocytosis. This is a follow up telephone visit. Most recent lab review from our office with completely normal CBC> I do believe she most likely has secondary intermittent thrombocytosis likely from chronic pain secondary to migraine No evidence of iron deficiency ANA pending, this will result in my chart.  At this time, she can RTC as needed. She expressed understanding of the plan.  HISTORY OF PRESENTING ILLNESS:  Anna Thomas 53 y.o. female is here because of thrombocytosis.  Discussed the use of AI scribe software for clinical note transcription with the patient, who gave verbal consent to proceed.  History of Present Illness This is a follow up telephone visit to review her labs.   MEDICAL HISTORY:  Past Medical History:  Diagnosis Date   Depression    Elevated blood-pressure reading without diagnosis of hypertension 03/13/2017   Exercise-induced asthma    Childhood   Female stress incontinence 05/02/2018   GERD (gastroesophageal reflux disease)    history after decadron reaction   Infertility, female    Insomnia    Migraine without aura and without status migrainosus, not intractable 03/13/2017   Mixed hyperlipidemia 03/13/2017   PCOS (polycystic ovarian syndrome)    Seasonal allergies    Sleep apnea    does not use CPAP    SURGICAL HISTORY: Past Surgical History:  Procedure Laterality Date   BLADDER SUSPENSION     CHOLECYSTECTOMY, LAPAROSCOPIC  11/13/2020   CYSTOSCOPY N/A 07/25/2018   Procedure: CYSTOSCOPY possible cysto;  Surgeon: Lillian Rein, MD;  Location: [facility];  Service: Gynecology;  Laterality: N/A;  possible cysto   FOOT SURGERY Right 2020   TOTAL  LAPAROSCOPIC HYSTERECTOMY WITH SALPINGECTOMY Bilateral 07/25/2018   Procedure: TOTAL LAPAROSCOPIC HYSTERECTOMY WITH SALPINGECTOMY possible BSO;  Surgeon: Lillian Rein, MD;  Location: [facility];  Service: Gynecology;  Laterality: Bilateral;  possible BSO   URETHRAL SLING  09/14/2012   SPARC   WISDOM TOOTH EXTRACTION      SOCIAL HISTORY: Social History   Socioeconomic History   Marital status: Divorced    Spouse name: Not on file   Number of children: Not on file   Years of education: Not on file   Highest education level: Not on file  Occupational History   Not on file  Tobacco Use   Smoking status: Never   Smokeless tobacco: Never  Vaping Use   Vaping status: Never Used  Substance and Sexual Activity   Alcohol use: Yes    Alcohol/week: 2.0 standard drinks of alcohol    Types: 2 Glasses of wine per week    Comment: occ   Drug use: Never   Sexual activity: Yes    Birth control/protection: Surgical    Comment: hysterectomy  Other Topics Concern   Not on file  Social History Narrative   Not on file   Social Drivers of Health   Financial Resource Strain: Not on file  Food Insecurity: Low Risk  (11/23/2023)   Received from [facility]   Hunger Vital Sign    Worried About Running Out of Food in the Last Year: Never true    Ran Out of Food in the Last Year: Never true  Transportation Needs: No Transportation  Needs (11/23/2023)   Received from [employer]    In the past 12 months, has lack of reliable transportation kept you from medical appointments, meetings, work or from getting things needed for daily living? : No  Physical Activity: Not on file  Stress: Not on file  Social Connections: Not on file  Intimate Partner Violence: Not on file    FAMILY HISTORY: Family History  Problem Relation Age of Onset   Prostate cancer Father    Skin cancer Father    Cardiomyopathy Sister    Ovarian cysts Sister    Colon polyps Neg Hx     Colon cancer Neg Hx    Esophageal cancer Neg Hx    Rectal cancer Neg Hx    Stomach cancer Neg Hx     ALLERGIES:  is allergic to dexamethasone and pseudoephedrine hcl.  MEDICATIONS:  Current Outpatient Medications  Medication Sig Dispense Refill   Atogepant (QULIPTA) 60 MG TABS Take 60 mg by mouth daily.     B Complex Vitamins (B COMPLEX PO) Take 1 capsule by mouth daily.     cetirizine (ZYRTEC) 10 MG tablet Take 10 mg by mouth daily as needed for allergies.     chlorzoxazone (PARAFON) 500 MG tablet Take by mouth 4 (four) times daily as needed for muscle spasms.     cholecalciferol (VITAMIN D) 1000 units tablet Take 1,000 Units by mouth daily. With calcium     Diclofenac Potassium 25 MG CAPS Take 25 mg by mouth daily as needed (for back and neck pain).     DULoxetine  (CYMBALTA ) 30 MG capsule Take 30 mg by mouth daily.     eletriptan (RELPAX) 40 MG tablet Take 40 mg by mouth every 2 (two) hours as needed for migraine or headache.      estradiol  (ESTRACE ) 1 MG tablet TAKE 1 TABLET BY MOUTH EVERY DAY 90 tablet 5   Galcanezumab-gnlm 120 MG/ML SOSY Inject 120 mg into the skin every 30 (thirty) days.     Multiple Vitamins-Minerals (SUPER THERA VITE M) TABS Take 1 tablet by mouth daily.      OnabotulinumtoxinA (BOTOX IJ) Inject 1 each into the muscle every 3 (three) months.      Probiotic Product (PROBIOTIC-10) CAPS Take 1 capsule by mouth daily.      traZODone (DESYREL) 50 MG tablet Take 50 mg by mouth at bedtime.      Ubrogepant (UBRELVY) 100 MG TABS Take by mouth as needed.     No current facility-administered medications for this visit.     PHYSICAL EXAMINATION: ECOG PERFORMANCE STATUS: 0 - Asymptomatic  PE deferred, telephone visit.  LABORATORY DATA:  I have reviewed the data as listed Lab Results  Component Value Date   WBC 6.9 04/21/2024   HGB 13.5 04/21/2024   HCT 40.1 04/21/2024   MCV 93.7 04/21/2024   PLT 396 04/21/2024     Chemistry      Component Value Date/Time    NA 139 09/10/2020 1930   K 4.3 09/10/2020 1930   CL 100 09/10/2020 1930   CO2 29 09/10/2020 1930   BUN 19 09/10/2020 1930   CREATININE 0.80 09/10/2020 1930      Component Value Date/Time   CALCIUM 9.8 09/10/2020 1930   ALKPHOS 76 09/10/2020 1930   AST 21 09/10/2020 1930   ALT 21 09/10/2020 1930   BILITOT 0.6 09/10/2020 1930       RADIOGRAPHIC STUDIES: I have personally reviewed the radiological images as  listed and agreed with the findings in the report. No results found.  All questions were answered. The patient knows to call the clinic with any problems, questions or concerns. I spent 10 minutes in the care of this patient including H and P, review of records, counseling and coordination of care.   I connected with  Anna Thomas on 04/26/24 by a telephone application and verified that I am speaking with the correct person using two identifiers.   I discussed the limitations of evaluation and management by telemedicine. The patient expressed understanding and agreed to proceed.  Location of pt: Home Location of provider: clinic   Murleen Arms, MD 04/26/2024 4:12 PM

## 2024-04-27 DIAGNOSIS — G4733 Obstructive sleep apnea (adult) (pediatric): Secondary | ICD-10-CM | POA: Diagnosis not present

## 2024-04-28 LAB — ANTINUCLEAR ANTIBODIES, IFA: ANA Ab, IFA: NEGATIVE

## 2024-05-23 ENCOUNTER — Encounter (INDEPENDENT_AMBULATORY_CARE_PROVIDER_SITE_OTHER): Payer: Self-pay | Admitting: Otolaryngology

## 2024-05-23 ENCOUNTER — Ambulatory Visit (INDEPENDENT_AMBULATORY_CARE_PROVIDER_SITE_OTHER): Admitting: Otolaryngology

## 2024-05-23 VITALS — BP 116/77 | HR 100 | Ht 64.0 in | Wt 175.0 lb

## 2024-05-23 DIAGNOSIS — H6991 Unspecified Eustachian tube disorder, right ear: Secondary | ICD-10-CM

## 2024-05-23 DIAGNOSIS — H7011 Chronic mastoiditis, right ear: Secondary | ICD-10-CM | POA: Diagnosis not present

## 2024-05-23 NOTE — Progress Notes (Signed)
 Dear Dr. Paulene Boron, Here is my assessment for our mutual patient, Anna Thomas. Thank you for allowing me the opportunity to care for your patient. Please do not hesitate to contact me should you have any other questions. Sincerely, Dr. Milon Aloe  Otolaryngology Clinic Note Referring provider: Dr. Paulene Boron HPI:  Anna Thomas is a 53 y.o. female kindly referred by Dr. Paulene Boron for evaluation of right ear issues.  Initial visit (05/2024): Patient reports: noted to have a bad sinus infection last summer requiring multiple rounds of antibiotics, and steroids. An ear tube was then placed by Dr. Alvie Ax which patient reports did not help. She continues to have persistent symptoms which include right ear post-auricular pressure (rarely in the ear), muffling in the ear, sharp discomfort -- wakes her up at night. No consistent drainage from the ear while tube was in place. Symptoms come and go, and are associated with weather and pressure changes. Some popping/crackling. Sometimes sx with valsalva. She has had multiple HT (last one with thresholds wnl) and CT and MRI. BMI 30 Patient denies: vertigo, drainage, tinnitus, hearing change Patient also denies barotrauma, vestibular suppressant use, ototoxic medication use Prior ear surgery: no  She does have a history of Migraines which were caused after post-concussive symptoms  H&N Surgery: Right tymp tube (2024) Personal or FHx of bleeding dz or anesthesia difficulty: no  GLP-1: no AP/AC: no  Tobacco: no  PMHx: Migraines  Independent Review of Additional Tests or Records:  Dr. Alvie Ax (03/27/2024, 02/03/2024): Noted sensation of fluid accumulation and pressure on right, with slight drainage. Hearing fluctuates and has improved with occasional pressure. No drainage.  CT independently interpreted (2025) noted to have some fluid trapped in bone, ME without isuses. Thickened R IS joint; Hearing noted wnl, gotten MRI and possibility of CSF leak considered; continue  flonase; In April, returned with MRI showing right mastoid effusion with focal dehiscence right tegmen mastoideum; Dx with patient re: right tympanomastoidectomy, Rx: ofloxacin.  Dr. Alvie Ax 07/15/2023: right tymp tube placed given ofloxacin.  Elio Gubler (11/23/2023): hearing improved somewhat, intermittent sx; sore throat 2-3 days; persistent sx, so CT ordered  Alverda Joe (07/13/2023): URI in may, with improvement after Augmentin, Pred; decline in hearing, intermittent popping; pressure sensation in ear; noted type C tymp, given pred and prednisone.   Audio (07/13/2023):   PMH/Meds/All/SocHx/FamHx/ROS:   Past Medical History:  Diagnosis Date   Depression    Elevated blood-pressure reading without diagnosis of hypertension 03/13/2017   Exercise-induced asthma    Childhood   Female stress incontinence 05/02/2018   GERD (gastroesophageal reflux disease)    history after decadron reaction   Infertility, female    Insomnia    Migraine without aura and without status migrainosus, not intractable 03/13/2017   Mixed hyperlipidemia 03/13/2017   PCOS (polycystic ovarian syndrome)    Seasonal allergies    Sleep apnea    does not use CPAP     Past Surgical History:  Procedure Laterality Date   BLADDER SUSPENSION     CHOLECYSTECTOMY, LAPAROSCOPIC  11/13/2020   CYSTOSCOPY N/A 07/25/2018   Procedure: CYSTOSCOPY possible cysto;  Surgeon: Lillian Rein, MD;  Location: [facility];  Service: Gynecology;  Laterality: N/A;  possible cysto   FOOT SURGERY Right 2020   TOTAL LAPAROSCOPIC HYSTERECTOMY WITH SALPINGECTOMY Bilateral 07/25/2018   Procedure: TOTAL LAPAROSCOPIC HYSTERECTOMY WITH SALPINGECTOMY possible BSO;  Surgeon: Lillian Rein, MD;  Location: [facility];  Service: Gynecology;  Laterality: Bilateral;  possible BSO  URETHRAL SLING  09/14/2012   SPARC   WISDOM TOOTH EXTRACTION      Family History  Problem Relation Age of Onset   Prostate cancer Father     Skin cancer Father    Cardiomyopathy Sister    Ovarian cysts Sister    Colon polyps Neg Hx    Colon cancer Neg Hx    Esophageal cancer Neg Hx    Rectal cancer Neg Hx    Stomach cancer Neg Hx      Social Connections: Not on file      Current Outpatient Medications:    Atogepant (QULIPTA) 60 MG TABS, Take 60 mg by mouth daily., Disp: , Rfl:    B Complex Vitamins (B COMPLEX PO), Take 1 capsule by mouth daily., Disp: , Rfl:    cetirizine (ZYRTEC) 10 MG tablet, Take 10 mg by mouth daily as needed for allergies., Disp: , Rfl:    chlorzoxazone (PARAFON) 500 MG tablet, Take by mouth 4 (four) times daily as needed for muscle spasms., Disp: , Rfl:    cholecalciferol (VITAMIN D) 1000 units tablet, Take 1,000 Units by mouth daily. With calcium, Disp: , Rfl:    Diclofenac Potassium 25 MG CAPS, Take 25 mg by mouth daily as needed (for back and neck pain)., Disp: , Rfl:    DULoxetine  (CYMBALTA ) 30 MG capsule, Take 30 mg by mouth daily., Disp: , Rfl:    eletriptan (RELPAX) 40 MG tablet, Take 40 mg by mouth every 2 (two) hours as needed for migraine or headache. , Disp: , Rfl:    estradiol  (ESTRACE ) 1 MG tablet, TAKE 1 TABLET BY MOUTH EVERY DAY, Disp: 90 tablet, Rfl: 5   fluticasone (FLONASE) 50 MCG/ACT nasal spray, SMARTSIG:1-2 Spray(s) Both Nares 1-2 Times Daily, Disp: , Rfl:    Galcanezumab-gnlm 120 MG/ML SOSY, Inject 120 mg into the skin every 30 (thirty) days., Disp: , Rfl:    Multiple Vitamins-Minerals (SUPER THERA VITE M) TABS, Take 1 tablet by mouth daily. , Disp: , Rfl:    ofloxacin (FLOXIN) 0.3 % OTIC solution, PLEASE SEE ATTACHED FOR DETAILED DIRECTIONS, Disp: , Rfl:    OnabotulinumtoxinA (BOTOX IJ), Inject 1 each into the muscle every 3 (three) months. , Disp: , Rfl:    Probiotic Product (PROBIOTIC-10) CAPS, Take 1 capsule by mouth daily. , Disp: , Rfl:    traZODone (DESYREL) 50 MG tablet, Take 50 mg by mouth at bedtime. , Disp: , Rfl:    Ubrogepant (UBRELVY) 100 MG TABS, Take by mouth  as needed., Disp: , Rfl:    Ubrogepant (UBRELVY) 100 MG TABS, Take 1 tablet twice a day by oral route as needed, every 2 hours., Disp: , Rfl:    Physical Exam:   BP 116/77 (BP Location: Right Arm, Patient Position: Sitting, Cuff Size: Large)   Pulse 100   Ht 5\' 4"  (1.626 m)   Wt 175 lb (79.4 kg)   LMP 07/03/2018 (Exact Date) Comment: Hysterectomy 07/25/2018  SpO2 98%   BMI 30.04 kg/m   Salient findings:  CN II-XII intact Given history and complaints, ear microscopy was indicated and performed for evaluation with findings as below in physical exam section and in procedures; Bilateral EAC clear and TM iintact on left, PE tube in place (dry, patent) with well pneumatized middle ear spaces Weber 512: mid Rinne 512: AC > BC b/l  Anterior rhinoscopy: Septum relatively midline; bilateral inferior turbinates without significant hypertrophy No lesions of oral cavity/oropharynx No obviously palpable neck masses/lymphadenopathy/thyromegaly No respiratory  distress or stridor  Seprately Identifiable Procedures:  Prior to initiating any procedures, risks/benefits/alternatives were explained to the patient and verbal consent obtained. Procedure: Bilateral ear microscopy using microscope (CPT [phone]) Pre-procedure diagnosis: hearing loss, right postauricular pressure Post-procedure diagnosis: same Indication: see above; given patient's otologic complaints and history, for improved and comprehensive examination of external ear and tympanic membrane, bilateral otologic examination using microscope was performed  Procedure: Patient was placed semi-recumbent. Both ear canals were examined using the microscope with findings above Patient tolerated the procedure well.   Impression & Plans:  Anna Thomas is a 53 y.o. female with:  1. Chronic right mastoiditis   2. Dysfunction of right eustachian tube    Noted right ETD symptoms despite tube placement with c/f small tegmen mastoideum encephalocele  and right mastoid effusion. Given persistent sx, do think reasonable to proceed with right tympmastoid. I did explain that as her sx did not improve despite tymp tube, she may still have sx post-procedure which she understood She had several questions I answered regarding the procedure. Follow up with me as needed  See below regarding exact medications prescribed this encounter including dosages and route: No orders of the defined types were placed in this encounter.     Thank you for allowing me the opportunity to care for your patient. Please do not hesitate to contact me should you have any other questions.  Sincerely, Milon Aloe, MD Otolaryngologist (ENT), [facility] Phone: [phone] Fax: [phone]  05/23/2024, 8:28 AM   I have personally spent 45 minutes involved in face-to-face and non-face-to-face activities for this patient on the day of the visit.  Professional time spent excludes any procedures performed but includes the following activities, in addition to those noted in the documentation: preparing to see the patient (review of outside documentation and results), performing a medically appropriate examination, counseling, documenting in the electronic health record, independently interpreting results (CT, MRI)

## 2024-06-20 HISTORY — PX: INNER EAR SURGERY: SHX679

## 2024-06-29 DIAGNOSIS — H9311 Tinnitus, right ear: Secondary | ICD-10-CM | POA: Diagnosis not present

## 2024-06-29 DIAGNOSIS — H9193 Unspecified hearing loss, bilateral: Secondary | ICD-10-CM | POA: Diagnosis not present

## 2024-06-29 DIAGNOSIS — Z0110 Encounter for examination of ears and hearing without abnormal findings: Secondary | ICD-10-CM | POA: Diagnosis not present

## 2024-06-29 DIAGNOSIS — Z9889 Other specified postprocedural states: Secondary | ICD-10-CM | POA: Diagnosis not present

## 2024-06-29 DIAGNOSIS — H9211 Otorrhea, right ear: Secondary | ICD-10-CM | POA: Diagnosis not present

## 2024-07-05 DIAGNOSIS — Z01818 Encounter for other preprocedural examination: Secondary | ICD-10-CM | POA: Diagnosis not present

## 2024-07-05 DIAGNOSIS — H9011 Conductive hearing loss, unilateral, right ear, with unrestricted hearing on the contralateral side: Secondary | ICD-10-CM | POA: Diagnosis not present

## 2024-07-05 DIAGNOSIS — J45909 Unspecified asthma, uncomplicated: Secondary | ICD-10-CM | POA: Diagnosis not present

## 2024-07-05 DIAGNOSIS — G4733 Obstructive sleep apnea (adult) (pediatric): Secondary | ICD-10-CM | POA: Diagnosis not present

## 2024-07-17 DIAGNOSIS — H9011 Conductive hearing loss, unilateral, right ear, with unrestricted hearing on the contralateral side: Secondary | ICD-10-CM | POA: Diagnosis not present

## 2024-07-17 DIAGNOSIS — H7121 Cholesteatoma of mastoid, right ear: Secondary | ICD-10-CM | POA: Diagnosis not present

## 2024-07-17 DIAGNOSIS — M898X8 Other specified disorders of bone, other site: Secondary | ICD-10-CM | POA: Diagnosis not present

## 2024-07-26 DIAGNOSIS — Z9889 Other specified postprocedural states: Secondary | ICD-10-CM | POA: Diagnosis not present

## 2024-07-26 DIAGNOSIS — Z09 Encounter for follow-up examination after completed treatment for conditions other than malignant neoplasm: Secondary | ICD-10-CM | POA: Diagnosis not present

## 2024-09-04 DIAGNOSIS — Z09 Encounter for follow-up examination after completed treatment for conditions other than malignant neoplasm: Secondary | ICD-10-CM | POA: Diagnosis not present

## 2024-09-04 DIAGNOSIS — Z9889 Other specified postprocedural states: Secondary | ICD-10-CM | POA: Diagnosis not present

## 2024-10-17 DIAGNOSIS — D1801 Hemangioma of skin and subcutaneous tissue: Secondary | ICD-10-CM | POA: Diagnosis not present

## 2024-10-17 DIAGNOSIS — L718 Other rosacea: Secondary | ICD-10-CM | POA: Diagnosis not present

## 2024-10-17 DIAGNOSIS — L570 Actinic keratosis: Secondary | ICD-10-CM | POA: Diagnosis not present

## 2024-10-17 DIAGNOSIS — L814 Other melanin hyperpigmentation: Secondary | ICD-10-CM | POA: Diagnosis not present

## 2024-10-17 DIAGNOSIS — L821 Other seborrheic keratosis: Secondary | ICD-10-CM | POA: Diagnosis not present

## 2024-10-19 DIAGNOSIS — H9191 Unspecified hearing loss, right ear: Secondary | ICD-10-CM | POA: Diagnosis not present

## 2024-10-19 DIAGNOSIS — H938X1 Other specified disorders of right ear: Secondary | ICD-10-CM | POA: Diagnosis not present

## 2024-10-19 DIAGNOSIS — H9311 Tinnitus, right ear: Secondary | ICD-10-CM | POA: Diagnosis not present

## 2024-10-19 DIAGNOSIS — H9011 Conductive hearing loss, unilateral, right ear, with unrestricted hearing on the contralateral side: Secondary | ICD-10-CM | POA: Diagnosis not present

## 2024-10-19 DIAGNOSIS — H9193 Unspecified hearing loss, bilateral: Secondary | ICD-10-CM | POA: Diagnosis not present

## 2024-10-19 DIAGNOSIS — Z9889 Other specified postprocedural states: Secondary | ICD-10-CM | POA: Diagnosis not present

## 2024-10-19 DIAGNOSIS — Z0110 Encounter for examination of ears and hearing without abnormal findings: Secondary | ICD-10-CM | POA: Diagnosis not present

## 2024-10-19 DIAGNOSIS — H6991 Unspecified Eustachian tube disorder, right ear: Secondary | ICD-10-CM | POA: Diagnosis not present

## 2024-11-06 DIAGNOSIS — M26609 Unspecified temporomandibular joint disorder, unspecified side: Secondary | ICD-10-CM | POA: Diagnosis not present

## 2024-11-06 DIAGNOSIS — M542 Cervicalgia: Secondary | ICD-10-CM | POA: Diagnosis not present

## 2024-11-06 DIAGNOSIS — G43809 Other migraine, not intractable, without status migrainosus: Secondary | ICD-10-CM | POA: Diagnosis not present

## 2024-11-09 ENCOUNTER — Encounter (HOSPITAL_BASED_OUTPATIENT_CLINIC_OR_DEPARTMENT_OTHER): Payer: Self-pay

## 2024-11-09 DIAGNOSIS — Z1231 Encounter for screening mammogram for malignant neoplasm of breast: Secondary | ICD-10-CM | POA: Diagnosis not present

## 2024-11-14 ENCOUNTER — Telehealth (HOSPITAL_BASED_OUTPATIENT_CLINIC_OR_DEPARTMENT_OTHER): Payer: Self-pay

## 2024-11-14 NOTE — Telephone Encounter (Signed)
 Received voicemail on nurses' line that orders for additional imaging are needed to be faxed to [facility].   Had Arland, CNM, sign orders from [facility]. Faxed back. Left message for patient to let her know that orders were faxed.   Morna LOISE Quale, RN

## 2024-11-20 ENCOUNTER — Other Ambulatory Visit (HOSPITAL_COMMUNITY)
Admission: RE | Admit: 2024-11-20 | Discharge: 2024-11-20 | Disposition: A | Discharge status: Home / Self Care | Source: Ambulatory Visit | Attending: Obstetrics & Gynecology | Admitting: Obstetrics & Gynecology

## 2024-11-20 ENCOUNTER — Ambulatory Visit (INDEPENDENT_AMBULATORY_CARE_PROVIDER_SITE_OTHER): Payer: 59 | Admitting: Obstetrics & Gynecology

## 2024-11-20 ENCOUNTER — Encounter (HOSPITAL_BASED_OUTPATIENT_CLINIC_OR_DEPARTMENT_OTHER): Payer: Self-pay | Admitting: Obstetrics & Gynecology

## 2024-11-20 ENCOUNTER — Ambulatory Visit (HOSPITAL_BASED_OUTPATIENT_CLINIC_OR_DEPARTMENT_OTHER): Payer: Self-pay | Admitting: Obstetrics & Gynecology

## 2024-11-20 VITALS — BP 122/88 | HR 95 | Ht 64.0 in | Wt 187.4 lb

## 2024-11-20 DIAGNOSIS — Z113 Encounter for screening for infections with a predominantly sexual mode of transmission: Secondary | ICD-10-CM | POA: Insufficient documentation

## 2024-11-20 DIAGNOSIS — Z79890 Hormone replacement therapy (postmenopausal): Secondary | ICD-10-CM | POA: Diagnosis not present

## 2024-11-20 DIAGNOSIS — G43009 Migraine without aura, not intractable, without status migrainosus: Secondary | ICD-10-CM | POA: Diagnosis not present

## 2024-11-20 DIAGNOSIS — Z8742 Personal history of other diseases of the female genital tract: Secondary | ICD-10-CM | POA: Diagnosis not present

## 2024-11-20 DIAGNOSIS — Z01419 Encounter for gynecological examination (general) (routine) without abnormal findings: Secondary | ICD-10-CM | POA: Diagnosis not present

## 2024-11-20 DIAGNOSIS — N393 Stress incontinence (female) (male): Secondary | ICD-10-CM

## 2024-11-20 DIAGNOSIS — Z90710 Acquired absence of both cervix and uterus: Secondary | ICD-10-CM | POA: Diagnosis not present

## 2024-11-20 DIAGNOSIS — Z1331 Encounter for screening for depression: Secondary | ICD-10-CM | POA: Diagnosis not present

## 2024-11-20 MED ORDER — ESTRADIOL 1 MG PO TABS: 1.0000 mg | ORAL_TABLET | Freq: Every day | ORAL | 3 refills | Status: AC

## 2024-11-20 MED ORDER — FLUCONAZOLE 150 MG PO TABS: 150.0000 mg | ORAL_TABLET | Freq: Once | ORAL | 1 refills | Status: AC

## 2024-11-20 NOTE — Progress Notes (Signed)
 ANNUAL EXAM Patient name: Anna Thomas MRN [id number]  Date of birth: [birth year] Chief Complaint:   Gynecologic Exam  History of Present Illness:   Anna Thomas is a 53 y.o. G67P2004 Caucasian female being seen today for a routine annual exam.  Both kids are in college.  Had ear surgery this year.  This has created a lot of changes.  Still has some hearing loss in the right ear.  Had positive depression screening today but feels this is situational.  Kids are coming home for the holidays which will be nice.    Had yeast infection a few weeks ago.  Took a diflucan  and this fixed it.  Does feel like she's had a few more yeast infections.    Frustrated with weight.    Patient's last menstrual period was 07/03/2018 (exact date).   Last pap 10/13/2021. Results were: NILM w/ HRHPV negative. H/O abnormal pap: no Last mammogram: 11/09/2024. Results were: abnormal focal asymmetry in the right breast. Patient is scheduled to have ultrasound and diagnostic Mammogram on Friday. Family h/o breast cancer: no Last colonoscopy: 12/18/2020. Results were: abnormal one polyp. Family h/o colorectal cancer: no     11/20/2024    9:05 AM 11/10/2023    9:14 AM 10/22/2022    8:32 AM  Depression screen PHQ 2/9  Decreased Interest 1 0 0  Down, Depressed, Hopeless 1 0 0  PHQ - 2 Score 2 0 0  Altered sleeping 1    Tired, decreased energy 1    Change in appetite 0    Feeling bad or failure about yourself  1    Trouble concentrating 2    Moving slowly or fidgety/restless 0    Suicidal thoughts 0    PHQ-9 Score 7      Review of Systems:   Pertinent items are noted in HPI Denies any vaginal bleeding.  Denies urinary changes.  Having a little more constipation.  Denies pelvic pain.   Pertinent History Reviewed:  Reviewed past medical,surgical, social and family history.  Reviewed problem list, medications and allergies. Physical Assessment:   Vitals:   11/20/24 0904  BP: 122/88  Pulse: 95   SpO2: 100%  Weight: 187 lb 6.4 oz (85 kg)  Height: 5' 4 (1.626 m)  Body mass index is 32.17 kg/m.        Physical Examination:   General appearance - well appearing, and in no distress  Mental status - alert, oriented to person, place, and time  Psych:  She has a normal mood and affect  Skin - warm and dry, normal color, no suspicious lesions noted  Chest - effort normal, all lung fields clear to auscultation bilaterally  Heart - normal rate and regular rhythm  Neck:  midline trachea, no thyromegaly or nodules  Breasts - breasts appear normal, no suspicious masses, no skin or nipple changes or  axillary nodes  Abdomen - soft, nontender, nondistended, no masses or organomegaly  Pelvic - VULVA: normal appearing vulva with no masses, tenderness or lesions   VAGINA: normal appearing vagina with normal color and discharge, no lesions   CERVIX: surgically absent  Thin prep pap is not indicted  UTERUS: uterus is felt to be normal size, shape, consistency and nontender   ADNEXA: No adnexal masses or tenderness noted.  Rectal - normal rectal, good sphincter tone, no masses felt  Extremities:  No swelling or varicosities noted  Chaperone present for exam  No results found for this or any  previous visit (from the past 24 hours).  Assessment & Plan:  1. Well woman exam with routine gynecological exam (Primary) - Pap smear not indicated today - Mammogram 10/2024.  Follow up scheduled this week. - Colonoscopy [id number].  Follow up 10 years - Bone mineral density not indicated.   - lab work done with PCP, Dr. Austin - vaccines reviewed/updated  2. Hormone replacement therapy - estradiol  (ESTRACE ) 1 MG tablet; Take 1 tablet (1 mg total) by mouth daily.  Dispense: 90 tablet; Refill: 3  3. History of hysterectomy  4. History of vaginitis - fluconazole  (DIFLUCAN ) 150 MG tablet; Take 1 tablet (150 mg total) by mouth once for 1 dose. Repeat in 72 hours if symptoms are not completely resolved.   Dispense: 2 tablet; Refill: 1  5. Migraine without aura and without status migrainosus, not intractable - followed by [facility]  6. Female stress incontinence  7.  STI screening - ancillary swab and blood work obtained today.  No orders of the defined types were placed in this encounter.   Meds:  Meds ordered this encounter  Medications   estradiol  (ESTRACE ) 1 MG tablet    Sig: Take 1 tablet (1 mg total) by mouth daily.    Dispense:  90 tablet    Refill:  3   fluconazole  (DIFLUCAN ) 150 MG tablet    Sig: Take 1 tablet (150 mg total) by mouth once for 1 dose. Repeat in 72 hours if symptoms are not completely resolved.    Dispense:  2 tablet    Refill:  1    Follow-up: Return in about 1 year (around 11/20/2025).  Ronal GORMAN Pinal, MD 11/20/2024 9:59 AM

## 2024-11-21 ENCOUNTER — Ambulatory Visit (HOSPITAL_BASED_OUTPATIENT_CLINIC_OR_DEPARTMENT_OTHER): Payer: Self-pay | Admitting: Obstetrics & Gynecology

## 2024-11-21 LAB — RPR+HBSAG+HIV
HIV Screen 4th Generation wRfx: NONREACTIVE
Hepatitis B Surface Ag: NEGATIVE
RPR Ser Ql: NONREACTIVE

## 2024-11-21 LAB — CERVICOVAGINAL ANCILLARY ONLY
Chlamydia: NEGATIVE
Comment: NEGATIVE
Comment: NORMAL
Neisseria Gonorrhea: NEGATIVE

## 2024-11-21 LAB — HEPATITIS C ANTIBODY: Hep C Virus Ab: NONREACTIVE

## 2024-11-23 ENCOUNTER — Other Ambulatory Visit (HOSPITAL_BASED_OUTPATIENT_CLINIC_OR_DEPARTMENT_OTHER): Payer: Self-pay | Admitting: Obstetrics & Gynecology

## 2024-11-24 DIAGNOSIS — R928 Other abnormal and inconclusive findings on diagnostic imaging of breast: Secondary | ICD-10-CM | POA: Diagnosis not present

## 2024-11-29 ENCOUNTER — Encounter (HOSPITAL_BASED_OUTPATIENT_CLINIC_OR_DEPARTMENT_OTHER): Payer: Self-pay

## 2024-12-01 DIAGNOSIS — G43909 Migraine, unspecified, not intractable, without status migrainosus: Secondary | ICD-10-CM | POA: Diagnosis not present

## 2024-12-01 DIAGNOSIS — D75839 Thrombocytosis, unspecified: Secondary | ICD-10-CM | POA: Diagnosis not present

## 2024-12-01 DIAGNOSIS — Z13220 Encounter for screening for lipoid disorders: Secondary | ICD-10-CM | POA: Diagnosis not present

## 2024-12-01 DIAGNOSIS — Z0000 Encounter for general adult medical examination without abnormal findings: Secondary | ICD-10-CM | POA: Diagnosis not present

## 2024-12-01 DIAGNOSIS — J309 Allergic rhinitis, unspecified: Secondary | ICD-10-CM | POA: Diagnosis not present

## 2024-12-01 DIAGNOSIS — G4733 Obstructive sleep apnea (adult) (pediatric): Secondary | ICD-10-CM | POA: Diagnosis not present
